# Patient Record
Sex: Female | Born: 1975 | Race: White | Hispanic: Yes | Marital: Single | Smoking: Former smoker
Health system: Southern US, Community
[De-identification: ages and names within clinical notes are randomized; demographics above are authoritative.]

## PROBLEM LIST (undated history)

## (undated) DIAGNOSIS — Z8742 Personal history of other diseases of the female genital tract: Secondary | ICD-10-CM

## (undated) DIAGNOSIS — Z8711 Personal history of peptic ulcer disease: Secondary | ICD-10-CM

## (undated) DIAGNOSIS — N920 Excessive and frequent menstruation with regular cycle: Secondary | ICD-10-CM

## (undated) DIAGNOSIS — N946 Dysmenorrhea, unspecified: Secondary | ICD-10-CM

## (undated) DIAGNOSIS — N979 Female infertility, unspecified: Secondary | ICD-10-CM

## (undated) DIAGNOSIS — Z8619 Personal history of other infectious and parasitic diseases: Secondary | ICD-10-CM

## (undated) DIAGNOSIS — F419 Anxiety disorder, unspecified: Secondary | ICD-10-CM

## (undated) DIAGNOSIS — F329 Major depressive disorder, single episode, unspecified: Secondary | ICD-10-CM

## (undated) DIAGNOSIS — L309 Dermatitis, unspecified: Secondary | ICD-10-CM

## (undated) DIAGNOSIS — Z86018 Personal history of other benign neoplasm: Secondary | ICD-10-CM

## (undated) DIAGNOSIS — F32A Depression, unspecified: Secondary | ICD-10-CM

## (undated) HISTORY — PX: WISDOM TOOTH EXTRACTION: SHX21

## (undated) HISTORY — DX: Major depressive disorder, single episode, unspecified: F32.9

## (undated) HISTORY — DX: Dysmenorrhea, unspecified: N94.6

## (undated) HISTORY — DX: Anxiety disorder, unspecified: F41.9

## (undated) HISTORY — DX: Depression, unspecified: F32.A

## (undated) HISTORY — DX: Female infertility, unspecified: N97.9

## (undated) HISTORY — DX: Dermatitis, unspecified: L30.9

---

## 2006-03-27 DIAGNOSIS — Z8742 Personal history of other diseases of the female genital tract: Secondary | ICD-10-CM

## 2006-03-27 HISTORY — DX: Personal history of other diseases of the female genital tract: Z87.42

## 2010-01-31 ENCOUNTER — Ambulatory Visit: Payer: Self-pay | Admitting: Internal Medicine

## 2010-01-31 DIAGNOSIS — K279 Peptic ulcer, site unspecified, unspecified as acute or chronic, without hemorrhage or perforation: Secondary | ICD-10-CM | POA: Insufficient documentation

## 2010-04-16 ENCOUNTER — Ambulatory Visit
Admission: RE | Admit: 2010-04-16 | Discharge: 2010-04-16 | Payer: Self-pay | Source: Home / Self Care | Attending: Internal Medicine | Admitting: Internal Medicine

## 2010-04-26 NOTE — Assessment & Plan Note (Signed)
Summary: NEW PT EST // RS   Vital Signs:  Patient profile:   35 year old female Height:      67.75 inches Weight:      157 pounds BMI:     24.14 Temp:     98.0 degrees F oral BP sitting:   98 / 70  (left arm) Cuff size:   regular  Vitals Entered By: Duard Brady LPN (January 31, 2010 2:08 PM) CC: new to establish - just moved here from [place]     declined flu vaccine Is Patient Diabetic? No CBG Result 137   CC:  new to establish - just moved here from [place]     declined flu vaccine.  History of Present Illness: 35 year old patient who is seen today to establish with her practice.  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 she has no health concerns and takes no chronic medication no nos.   Preventive Screening-Counseling & Management  Alcohol-Tobacco     Smoking Status: never  Allergies (verified): 1)  ! * Antihistamine  Past History:  Past Medical History: unremarkable Peptic ulcer disease- age 69  Past Surgical History: unremarkable  wisdom teeth, extraction  Family History: Reviewed history and no changes required. father died age 61.  History melanoma type 2 diabetes prostate cancer and cerebrovascular disease mother is 86 type 2 diabetes, obesity, breast  cancer, history of cerebrovascular disease  two brothers, one sister two half-brothers and two half-sisters  Social History: Reviewed history and no changes required. 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 discontinued tobacco earlier this yearSmoking Status:  never  Physical Exam  General:  Well-developed,well-nourished,in no acute distress; alert,appropriate and cooperative throughout examination Head:  Normocephalic and atraumatic without obvious abnormalities. No apparent alopecia or balding. Eyes:  No corneal or conjunctival inflammation noted. EOMI. Perrla. Funduscopic exam benign, without hemorrhages, exudates or papilledema. Vision grossly  normal. Ears:  External ear exam shows no significant lesions or deformities.  Otoscopic examination reveals clear canals, tympanic membranes are intact bilaterally without bulging, retraction, inflammation or discharge. Hearing is grossly normal bilaterally. Mouth:  Oral mucosa and oropharynx without lesions or exudates.  Teeth in good repair. Neck:  No deformities, masses, or tenderness noted. Chest Wall:  No deformities, masses, or tenderness noted. Lungs:  Normal respiratory effort, chest expands symmetrically. Lungs are clear to auscultation, no crackles or wheezes. Heart:  Normal rate and regular rhythm. S1 and S2 normal without gallop, murmur, click, rub or other extra sounds. Abdomen:  Bowel sounds positive,abdomen soft and non-tender without masses, organomegaly or hernias noted. Msk:  No deformity or scoliosis noted of thoracic or lumbar spine.   Pulses:  R and L carotid,radial,femoral,dorsalis pedis and posterior tibial pulses are full and equal bilaterally Extremities:  No clubbing, cyanosis, edema, or deformity noted with normal full range of motion of all joints.   Neurologic:  alert & oriented X3, cranial nerves II-XII intact, and gait normal.   Skin:  Intact without suspicious lesions or rashes Cervical Nodes:  No lymphadenopathy noted Psych:  Cognition and judgment appear intact. Alert and cooperative with normal attention span and concentration. No apparent delusions, illusions, hallucinations   Impression & Recommendations:  Problem # 1:  Preventive Health Care (ICD-V70.0)  Complete Medication List: 1)  No Current Rx Meds   Other Orders: Capillary Blood Glucose/CBG (27253)  Patient Instructions: 1)  Limit your Sodium (Salt). 2)  It is important that you exercise regularly at  least 20 minutes 5 times a week. If you develop chest pain, have severe difficulty breathing, or feel very tired , stop exercising immediately and seek medical attention. 3)  Please schedule a  follow-up appointment as needed.   Orders Added: 1)  Capillary Blood Glucose/CBG [82948] 2)  New Patient 18-39 years [99385]

## 2010-04-28 NOTE — Assessment & Plan Note (Signed)
Summary: ? PINKEYE//CCM   Vital Signs:  Patient profile:   35 year old female Weight:      161 pounds Temp:     99.9 degrees F oral Pulse rate:   80 / minute BP sitting:   110 / 58  (left arm) Cuff size:   regular  Vitals Entered By: Lamar Sprinkles, CMA (April 16, 2010 9:16 AM) CC: Right eye - matted, uncomfortable & itchy x 2 days/SD   History of Present Illness: Laurie Lewis comes in today  for  2-3 days of right eye redness discharge and irritation  . No uri although slightly stuffy o right. No fever and no exposrues.   NO hx of same since a child  Has been using compresses and eye wash helping it. better today but kept appt  .   No contacts and  NO fever  uri .    Preventive Screening-Counseling & Management  Alcohol-Tobacco     Smoking Status: never  Allergies: 1)  ! 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   Eyes:  vision grossly intact, pupils equal, and pupils round.  right eye with mild injection and no acitve dc at present  Ears:  R ear normal, L ear normal, and no external deformities.   Nose:  no external deformity, no external erythema, and no nasal discharge.  non tender  piercing no infection Mouth:  pharynx pink and moist.   Neck:  No deformities, masses, or tenderness noted. Lungs:  normal respiratory effort and no intercostal retractions.   Heart:  normal rate and regular rhythm.   Skin:  turgor normal and color normal.   Cervical Nodes:  No lymphadenopathy noted Psych:  Oriented X3, normally interactive, and good eye contact.     Impression &  Recommendations:  Problem # 1:  CONJUNCTIVITIS (ICD-372.30) right   seems to get better with washes   poss viral vs bacterial   if   getting worse again add antibioitc . Expectant management  Her updated medication list for this problem includes:    Polymyxin B-trimethoprim 10000-0.1 Unit/ml-% Soln (Polymyxin b-trimethoprim) .[name]... 1-2 drops in affected eye   qid  for 7 days  as directed  Complete Medication List: 1)  No Current Rx Meds  2)  Polymyxin B-trimethoprim 10000-0.1 Unit/ml-% Soln (Polymyxin b-trimethoprim) .[name].. 1-2 drops in affected eye   qid  for 7 days  as directed  Patient Instructions: 1)  continue compresses   and can add antibiotic drops if needed. 2)  i f   persistent or  progressive  or change in vision call for follow up.  Prescriptions: POLYMYXIN B-TRIMETHOPRIM 10000-0.1 UNIT/ML-% SOLN (POLYMYXIN B-TRIMETHOPRIM) 1-2 drops in affected eye   qid  for 7 days  as directed  #1 bottle x 0   Entered and Authorized by:   Madelin Headings MD   Signed by:   Madelin Headings MD on 04/16/2010   Method used:   Print then Give to Patient   RxID:   [id number]    Orders Added: 1)  Est. Patient Level III [14782]

## 2010-06-29 ENCOUNTER — Ambulatory Visit (INDEPENDENT_AMBULATORY_CARE_PROVIDER_SITE_OTHER): Payer: BC Managed Care – PPO | Admitting: Family Medicine

## 2010-06-29 ENCOUNTER — Encounter: Payer: Self-pay | Admitting: Family Medicine

## 2010-06-29 VITALS — BP 100/60 | Temp 98.8°F | Ht 68.0 in | Wt 160.0 lb

## 2010-06-29 DIAGNOSIS — R42 Dizziness and giddiness: Secondary | ICD-10-CM

## 2010-06-29 MED ORDER — MECLIZINE HCL 25 MG PO TABS: 25.0000 mg | ORAL_TABLET | Freq: Three times a day (TID) | ORAL | Status: AC | PRN

## 2010-06-29 NOTE — Patient Instructions (Signed)
Benign Positional Vertigo (BPV)  Vertigo is a feeling that you are unsteady or that you or your surroundings are moving. Benign positional vertigo (BPV) is the most common form of vertigo. Benign means it does not have a serious cause. It is an upset in the balance system in your middle ear. This is troublesome but usually not serious. A viral infection or head injury are common causes, but often no cause is found. It is more common as we grow older.  SYMPTOMS  Sudden dizziness happens when you move your head in different directions. Some of the problems that come with this are:   Loss of balance    Throwing up      Blurred vision     Dizziness      Feeling sick to your stomach      DIAGNOSIS  Your caregiver may do some specialized testing to prove what is wrong.  HOME CARE INSTRUCTIONS   Rest and eat a well balanced diet.    Move slowly and do not make sudden body or head movements.    Do not drive a car or do any activities that could hurt you or others.    Lie down and rest. Take precautions to prevent falls.   SEEK IMMEDIATE MEDICAL CARE IF:   You develop headaches which are severe or lasting.    You develop continued vomiting.    A temporary loss or change of vision appears.    You notice temporary numbness on one side of your body.    You are temporarily unable to speak.    Temporary areas of weakness develop.    You have weakness or numbness in the face, arms, or legs.    You notice dizziness or difficulty walking.    You experience slurred speech or difficulty swallowing.   MAKE SURE YOU:     Understand these instructions.    Will watch your condition.    Will get help right away if you are not doing well or get worse.   Document Released: 12/19/2005 Document Re-Released: 06/29/2008  ExitCare Patient Information 2011 [employer].

## 2010-06-29 NOTE — Progress Notes (Signed)
  Subjective:    Patient ID: Laurie Lewis, female    DOB: [birth year], 35 y.o.   MRN: [id number]  HPI Patient seen with vertigo symptoms. Onset 2 nights ago after getting up to go the bathroom. Intermittent symptoms since then. Relatively mild symptoms. No vomiting but occasional mild nausea. Denies any fever, chills, headache, blurred vision, or any focal weakness. No recent nasal congestion. No hearing changes. No tinnitus.   Review of Systems  Constitutional: Negative for fever, chills and fatigue.  HENT: Negative for hearing loss, sore throat, tinnitus and ear discharge.   Respiratory: Negative for cough.   Cardiovascular: Negative for chest pain.  Neurological: Positive for dizziness. Negative for syncope, facial asymmetry, speech difficulty, weakness, numbness and headaches.  Hematological: Negative for adenopathy.       Objective:   Physical Exam  Constitutional: She is oriented to person, place, and time. She appears well-developed and well-nourished.  HENT:  Head: Normocephalic and atraumatic.  Right Ear: External ear normal.  Left Ear: External ear normal.  Mouth/Throat: Oropharynx is clear and moist. No oropharyngeal exudate.  Neck: Neck supple. No thyromegaly present.  Cardiovascular: Normal rate, regular rhythm and normal heart sounds.   No murmur heard. Pulmonary/Chest: Effort normal and breath sounds normal. She has no wheezes. She has no rales.  Lymphadenopathy:    She has no cervical adenopathy.  Neurological: She is alert and oriented to person, place, and time. She has normal reflexes. No cranial nerve deficit.  Skin: No rash noted.  Psychiatric: She has a normal mood and affect.          Assessment & Plan:  Vertigo. Suspect benign positional vertigo. Meclizine 25 mg every 6 hours when necessary. Educational handout given. Touch base with primary physician at persist one to 2 weeks

## 2010-12-05 ENCOUNTER — Other Ambulatory Visit (HOSPITAL_COMMUNITY): Payer: Self-pay | Admitting: Gynecology

## 2010-12-05 DIAGNOSIS — N979 Female infertility, unspecified: Secondary | ICD-10-CM

## 2010-12-08 ENCOUNTER — Ambulatory Visit (HOSPITAL_COMMUNITY)
Admission: RE | Admit: 2010-12-08 | Discharge: 2010-12-08 | Disposition: A | Discharge status: Home / Self Care | Payer: BC Managed Care – PPO | Source: Ambulatory Visit | Attending: Gynecology | Admitting: Gynecology

## 2010-12-08 DIAGNOSIS — N979 Female infertility, unspecified: Secondary | ICD-10-CM | POA: Insufficient documentation

## 2010-12-08 MED ORDER — IOHEXOL 300 MG/ML  SOLN: 15.0000 mL | Freq: Once | INTRAMUSCULAR | Status: AC | PRN

## 2011-08-16 ENCOUNTER — Ambulatory Visit (INDEPENDENT_AMBULATORY_CARE_PROVIDER_SITE_OTHER): Payer: BC Managed Care – PPO | Admitting: Family Medicine

## 2011-08-16 VITALS — BP 116/68 | HR 60 | Temp 98.6°F | Resp 16 | Ht 68.0 in | Wt 166.0 lb

## 2011-08-16 DIAGNOSIS — G4700 Insomnia, unspecified: Secondary | ICD-10-CM

## 2011-08-16 DIAGNOSIS — L237 Allergic contact dermatitis due to plants, except food: Secondary | ICD-10-CM

## 2011-08-16 DIAGNOSIS — L255 Unspecified contact dermatitis due to plants, except food: Secondary | ICD-10-CM

## 2011-08-16 DIAGNOSIS — R52 Pain, unspecified: Secondary | ICD-10-CM

## 2011-08-16 MED ORDER — METHYLPREDNISOLONE SODIUM SUCC 125 MG IJ SOLR
125.0000 mg | Freq: Once | INTRAMUSCULAR | Status: AC
  Administered 2011-08-16: 125 mg via INTRAMUSCULAR

## 2011-08-16 MED ORDER — ZOLPIDEM TARTRATE 5 MG PO TABS: 5.0000 mg | ORAL_TABLET | Freq: Every evening | ORAL | Status: DC | PRN

## 2011-08-16 MED ORDER — PREDNISONE 10 MG PO TABS: ORAL_TABLET | ORAL | Status: AC

## 2011-08-16 MED ORDER — TRAMADOL HCL 50 MG PO TABS: 50.0000 mg | ORAL_TABLET | Freq: Three times a day (TID) | ORAL | Status: AC | PRN

## 2011-08-16 NOTE — Progress Notes (Signed)
[facility]:  Office Visit  Chief Complaint:  Chief Complaint  Patient presents with  . Rash    x 5 days  arms legs, itchy, red, spreading.  affecting sleep    HPI: Laurie Lewis is a 36 y.o. female who complains of:  5 day history of posion ivy was given Medrol dose pack. No relief. Spreading. Was started on Keflex today. BUt her rash has gotten worse. She has tried Zyrtec without relief, calamine lotion, and also topical antihistaimines. Can;t sleep.   Past Medical History  Diagnosis Date  . Peptic ulcer disease    Past Surgical History  Procedure Date  . Wisdom tooth extraction    History   Social History  . Marital Status: Married    Spouse Name: N/A    Number of Children: N/A  . Years of Education: N/A   Social History Main Topics  . Smoking status: Former Smoker -- 0.5 packs/day for 20 years    Types: Cigarettes    Quit date: 06/28/2009  . Smokeless tobacco: None  . Alcohol Use: None  . Drug Use: None  . Sexually Active: None   Other Topics Concern  . None   Social History Narrative  . None   No family history on file. No Known Allergies Prior to Admission medications   Medication Sig Start Date End Date Taking? Authorizing Provider  cephALEXin (KEFLEX) 500 MG capsule Take 500 mg by mouth 3 (three) times daily.   Yes Historical Provider, MD  methylPREDNISolone (MEDROL) 4 MG tablet Take 4 mg by mouth daily.   Yes Historical Provider, MD     ROS: The patient denies fevers, chills, night sweats, unintentional weight loss, chest pain, palpitations, wheezing, dyspnea on exertion, nausea, vomiting, abdominal pain, dysuria, hematuria, melena, numbness, weakness, or tingling. + worsening rash  All other systems have been reviewed and were otherwise negative with the exception of those mentioned in the HPI and as above.    PHYSICAL EXAM: Filed Vitals:   08/16/11 1440  BP: 116/68  Pulse: 60  Temp: 98.6 F (37 C)  Resp: 16   Filed  Vitals:   08/16/11 1440  Height: 5\' 8"  (1.727 m)  Weight: 166 lb (75.297 kg)   Body mass index is 25.24 kg/(m^2).  General: Alert, tearful HEENT:  Normocephalic, atraumatic, oropharynx patent. EOMI, PERRLA, Tm NL. OP nl.  Cardiovascular:  Regular rate and rhythm, no rubs murmurs or gallops.  No Carotid bruits, radial pulse intact. No pedal edema.  Respiratory: Clear to auscultation bilaterally.  No wheezes, rales, or rhonchi.  No cyanosis, no use of accessory musculature GI: No organomegaly, abdomen is soft and non-tender, positive bowel sounds.  No masses. Skin: + diffuse erythematous, warm rash on bilateral arms, legs and stomach. Minimal excoriation. No weeping drainage.  Neurologic: Facial musculature symmetric. Psychiatric: Patient is appropriate throughout our interaction. Lymphatic: No cervical lymphadenopathy Musculoskeletal: Gait intact.   LABS: Results for orders placed in visit on 01/31/10  CONVERTED [facility] LAB      Component Value Range   Blood Glucose, Fingerstick 137       EKG/XRAY:   Primary read interpreted by Dr. Conley Rolls at [facility].   ASSESSMENT/PLAN: Encounter Diagnoses  Name Primary?  . Poison ivy Yes  . Pain   . Insomnia    1. Solumedrol injection x 1 in office, DC MEdrol Dose pack. Gave patient a longer prednisone taper. Encourage continued antihistamine use Zyrtec. Aloe prn. Cleanse with soap and water. Continue with  Keflex since already started. Advise to take PPI if start having GI upset from steroid use.   2. Pain control with Tramadol #20 3. Gave ambien 5 mg qhs prn #5.     Hamilton Capri PHUONG, DO 08/16/2011 3:36 PM

## 2011-12-06 ENCOUNTER — Ambulatory Visit (INDEPENDENT_AMBULATORY_CARE_PROVIDER_SITE_OTHER): Payer: Self-pay

## 2011-12-06 DIAGNOSIS — F4320 Adjustment disorder, unspecified: Secondary | ICD-10-CM

## 2011-12-13 ENCOUNTER — Ambulatory Visit (INDEPENDENT_AMBULATORY_CARE_PROVIDER_SITE_OTHER): Payer: Self-pay

## 2011-12-13 DIAGNOSIS — F4320 Adjustment disorder, unspecified: Secondary | ICD-10-CM

## 2011-12-20 ENCOUNTER — Ambulatory Visit (INDEPENDENT_AMBULATORY_CARE_PROVIDER_SITE_OTHER): Payer: Self-pay

## 2011-12-20 DIAGNOSIS — F4320 Adjustment disorder, unspecified: Secondary | ICD-10-CM

## 2013-07-19 ENCOUNTER — Ambulatory Visit (INDEPENDENT_AMBULATORY_CARE_PROVIDER_SITE_OTHER): Payer: BC Managed Care – PPO | Admitting: Family Medicine

## 2013-07-19 VITALS — BP 110/62 | HR 80 | Temp 98.2°F | Resp 16 | Ht 68.0 in | Wt 160.4 lb

## 2013-07-19 DIAGNOSIS — R509 Fever, unspecified: Secondary | ICD-10-CM

## 2013-07-19 DIAGNOSIS — R6883 Chills (without fever): Secondary | ICD-10-CM

## 2013-07-19 DIAGNOSIS — B349 Viral infection, unspecified: Secondary | ICD-10-CM

## 2013-07-19 DIAGNOSIS — B9789 Other viral agents as the cause of diseases classified elsewhere: Secondary | ICD-10-CM

## 2013-07-19 LAB — POCT UA - MICROSCOPIC ONLY
CASTS, UR, LPF, POC: NEGATIVE
CRYSTALS, UR, HPF, POC: NEGATIVE
MUCUS UA: NEGATIVE
YEAST UA: NEGATIVE

## 2013-07-19 LAB — POCT CBC
Granulocyte percent: 35 %G — AB (ref 37–80)
HCT, POC: 37.5 % — AB (ref 37.7–47.9)
Hemoglobin: 12.2 g/dL (ref 12.2–16.2)
LYMPH, POC: 3 (ref 0.6–3.4)
MCH: 29.6 pg (ref 27–31.2)
MCHC: 32.5 g/dL (ref 31.8–35.4)
MCV: 91 fL (ref 80–97)
MID (CBC): 0.7 (ref 0–0.9)
MPV: 8.3 fL (ref 0–99.8)
PLATELET COUNT, POC: 192 10*3/uL (ref 142–424)
POC Granulocyte: 2 (ref 2–6.9)
POC LYMPH %: 53.3 % — AB (ref 10–50)
POC MID %: 11.7 % (ref 0–12)
RBC: 4.12 M/uL (ref 4.04–5.48)
RDW, POC: 13.3 %
WBC: 5.7 10*3/uL (ref 4.6–10.2)

## 2013-07-19 LAB — POCT URINALYSIS DIPSTICK
Bilirubin, UA: NEGATIVE
Glucose, UA: NEGATIVE
Ketones, UA: NEGATIVE
Leukocytes, UA: NEGATIVE
NITRITE UA: NEGATIVE
PROTEIN UA: NEGATIVE
SPEC GRAV UA: 1.01
UROBILINOGEN UA: 0.2
pH, UA: 7

## 2013-07-19 NOTE — Progress Notes (Signed)
Subjective: Patient comes in here not feeling well. She has had intermittent low-grade fevers. She has some discomfort/stuffiness sensation in her ears. She did have a sore throat a little bit of couple of weeks ago preceding all of this. Is been a days now she has not been feeling well. She has a history of endometriosis. She thinks she is premenopausal but he still has one ovary and the cyst on the other and various things going on. She has not had a little stomach discomfort. She had a little fever again this morning and took some ibuprofen. She's been taking a lot of ibuprofen for the achiness. 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 Patient, that she is not pregnant. Her menstrual cycle is a few days late, not usual for her.  Objective: TMs normal on the right. Also normal the left has little hair in the canal. Her throat is clear. Neck supple without significant nodes. Chest clear to auscultation. Heart regular without murmurs. Abdomen soft, nontender. Skin unremarkable. No rashes. No CVA tenderness.  Assessment: Fever Achiness Ear discomfort Stress Delayed menses  Plan: CBC and urine  Results for orders placed in visit on 07/19/13  POCT CBC      Result Value Ref Range   WBC 5.7  4.6 - 10.2 K/uL   Lymph, poc 3.0  0.6 - 3.4   POC LYMPH PERCENT 53.3 (*) 10 - 50 %L   MID (cbc) 0.7  0 - 0.9   POC MID % 11.7  0 - 12 %M   POC Granulocyte 2.0  2 - 6.9   Granulocyte percent 35.0 (*) 37 - 80 %G   RBC 4.12  4.04 - 5.48 M/uL   Hemoglobin 12.2  12.2 - 16.2 g/dL   HCT, POC 37.5 (*) 37.7 - 47.9 %   MCV 91.0  80 - 97 fL   MCH, POC 29.6  27 - 31.2 pg   MCHC 32.5  31.8 - 35.4 g/dL   RDW, POC 13.3     Platelet Count, POC 192  142 - 424 K/uL   MPV 8.3  0 - 99.8 fL  POCT UA - MICROSCOPIC ONLY      Result Value Ref Range   WBC, Ur, HPF, POC 0-1     RBC, urine, microscopic 2-4     Bacteria, U Microscopic 1+     Mucus, UA neg     Epithelial cells,  urine per micros 0-2     Crystals, Ur, HPF, POC neg     Casts, Ur, LPF, POC neg     Yeast, UA neg    POCT URINALYSIS DIPSTICK      Result Value Ref Range   Color, UA yellow     Clarity, UA hazy     Glucose, UA neg     Bilirubin, UA neg     Ketones, UA neg     Spec Grav, UA 1.010     Blood, UA large     pH, UA 7.0     Protein, UA neg     Urobilinogen, UA 0.2     Nitrite, UA neg     Leukocytes, UA Negative     Nothing specific is found. Maybe just a viral syndrome. Return if worse.

## 2013-07-19 NOTE — Patient Instructions (Addendum)
Rest  Drink plenty of fluids  Return if further symptoms or if fever persists  Ibuprofen or tylenol for aching or fever

## 2014-06-18 ENCOUNTER — Ambulatory Visit (INDEPENDENT_AMBULATORY_CARE_PROVIDER_SITE_OTHER): Payer: BC Managed Care – PPO | Admitting: Emergency Medicine

## 2014-06-18 VITALS — BP 120/72 | HR 74 | Temp 97.9°F | Resp 16 | Ht 68.0 in | Wt 149.0 lb

## 2014-06-18 DIAGNOSIS — N946 Dysmenorrhea, unspecified: Secondary | ICD-10-CM | POA: Diagnosis not present

## 2014-06-18 DIAGNOSIS — N809 Endometriosis, unspecified: Secondary | ICD-10-CM

## 2014-06-18 MED ORDER — IBUPROFEN 800 MG PO TABS: 800.0000 mg | ORAL_TABLET | Freq: Four times a day (QID) | ORAL | Status: DC | PRN

## 2014-06-18 MED ORDER — HYDROCODONE-ACETAMINOPHEN 5-325 MG PO TABS: 1.0000 | ORAL_TABLET | ORAL | Status: DC | PRN

## 2014-06-18 NOTE — Progress Notes (Signed)
[facility] [address] [place] [address] [phone]  Date:  06/18/2014   Name:  Laurie Lewis   DOB:  December 19, 1975   MRN:  [id number]  PCP:  Nyoka Cowden, MD    Chief Complaint: Abdominal Pain; arm cramps; and leg cramps   History of Present Illness:  Laurie Lewis is a 39 y.o. very pleasant female patient who presents with the following:  Long history of endometriosis and ovarian cyst Last saw GYN 18 months ago Has increased dysmenorrhea.  No vaginal discharge or dyspareunia Lowe abdominal pain before menses over past several cycles No clots or tissue Nulligravida. No dysuria, urgency or frequency Gets some pain relief with OTC motrin No fever or chills No improvement with over the counter medications or other home remedies.  Denies other complaint or health concern today.   Patient Active Problem List   Diagnosis Date Noted  . PEPTIC ULCER DISEASE 01/31/2010    Past Medical History  Diagnosis Date  . Peptic ulcer disease     Past Surgical History  Procedure Laterality Date  . Wisdom tooth extraction      History  Substance Use Topics  . Smoking status: Former Smoker -- 0.50 packs/day for 20 years    Types: Cigarettes    Quit date: 06/28/2009  . Smokeless tobacco: Not on file  . Alcohol Use: Not on file    History reviewed. No pertinent family history.  No Known Allergies  Medication list has been reviewed and updated.  Current Outpatient Prescriptions on File Prior to Visit  Medication Sig Dispense Refill  . zolpidem (AMBIEN) 5 MG tablet Take 1 tablet (5 mg total) by mouth at bedtime as needed for sleep. 5 tablet 0   No current facility-administered medications on file prior to visit.    Review of Systems:  As per HPI, otherwise negative.    Physical Examination: Filed Vitals:   06/18/14 1412  BP: 120/72  Pulse: 74  Temp: 97.9 F (36.6 C)  Resp: 16   Filed Vitals:   06/18/14 1412  Height: 5\' 8"   (1.727 m)  Weight: 149 lb (67.586 kg)   Body mass index is 22.66 kg/(m^2). Ideal Body Weight: Weight in (lb) to have BMI = 25: 164.1  GEN: WDWN, NAD, Non-toxic, A & O x 3 HEENT: Atraumatic, Normocephalic. Neck supple. No masses, No LAD. Ears and Nose: No external deformity. CV: RRR, No M/G/R. No JVD. No thrill. No extra heart sounds. PULM: CTA B, no wheezes, crackles, rhonchi. No retractions. No resp. distress. No accessory muscle use. ABD: S, lower abdominal suprapubic tenderness, ND, +BS. No rebound. No HSM. EXTR: No c/c/e NEURO Normal gait.  PSYCH: Normally interactive. Conversant. Not depressed or anxious appearing.  Calm demeanor.    Assessment and Plan: Dysmenorrhea Pelvic pain GYN norco  Signed,  Ellison Carwin, MD

## 2014-06-22 ENCOUNTER — Telehealth: Payer: Self-pay | Admitting: Internal Medicine

## 2014-06-22 DIAGNOSIS — N809 Endometriosis, unspecified: Secondary | ICD-10-CM

## 2014-06-22 NOTE — Telephone Encounter (Signed)
Please see message and advise 

## 2014-06-22 NOTE — Telephone Encounter (Signed)
Pt has not been seen by in 3+ years. Wants to come back in to be seen for leg pain. 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 Does not want to see Dr Avanell Shackleton going forward. Will you see pt even though it has been over 3 years? I will call her back and advise.

## 2014-06-22 NOTE — Telephone Encounter (Signed)
Yes, okay to refer to gynecology

## 2014-06-23 NOTE — Telephone Encounter (Signed)
Estill Bamberg, Dr.K said he will see pt can re-establish care and also tell pt order for referral to GYN was done and someone will contact her regarding an appointment. Thanks

## 2014-07-06 ENCOUNTER — Ambulatory Visit (INDEPENDENT_AMBULATORY_CARE_PROVIDER_SITE_OTHER): Payer: BC Managed Care – PPO | Admitting: Obstetrics and Gynecology

## 2014-07-06 ENCOUNTER — Encounter: Payer: Self-pay | Admitting: Obstetrics and Gynecology

## 2014-07-06 VITALS — BP 100/70 | HR 66 | Resp 16 | Ht 68.0 in | Wt 142.4 lb

## 2014-07-06 DIAGNOSIS — N946 Dysmenorrhea, unspecified: Secondary | ICD-10-CM

## 2014-07-06 DIAGNOSIS — N939 Abnormal uterine and vaginal bleeding, unspecified: Secondary | ICD-10-CM | POA: Diagnosis not present

## 2014-07-06 LAB — HEMOGLOBIN, FINGERSTICK: HEMOGLOBIN, FINGERSTICK: 13.3 g/dL (ref 12.0–16.0)

## 2014-07-06 NOTE — Progress Notes (Signed)
Patient declined to schedule pelvic ultrasound at this time. Has pelvic ultrasound scheduled on 07-20-14 with another practice and will forward results to [place]. Requests office visit following this appointment to review pelvic ultrasound with Dr Quincy Simmonds. Appointment 07-24-14 at 1030 as requested.

## 2014-07-06 NOTE — Progress Notes (Signed)
Patient ID: Laurie Lewis, female   DOB: [birth year], 39 y.o.   MRN: [id number] GYNECOLOGY VISIT  PCP:  None  Referring provider:  Urgent Care  HPI: 39 y.o.   Married  Caucasian  female   [id number] with Patient's last menstrual period was 06/19/2014 (exact date).   here for heavy menstrual periods with pain in legs and hip. Her last menstrual cycle did last for 16 days.  Having vaginal pain.   Always had painful periods.  Heavy for the last 10 menses.  Severe pain for the last 2 cycles.  Had leg pain which started before her menses even began.  Ibuprofen would dull the pain in the past.  Hydrocodone from urgent care helps pain.  Feels pressure - numbness and hurting at the same time.   Last menses did not stop.  Placed on Provera 30 mg since 07/03/14 through Dr. Leo Grosser.   This finally stopped her bleeding on day 16. (This is the only time it is irregular.) Has not tried Tramadol from Dr. Leo Grosser yet.   Bilateral calf discomfort in the last week.   No prior investigation of pain and bleeding.  Stopped Naproxen due to history of ulcer.  Able to take ibuprofen.   History of fertility evaluation.  Last ultrasound was two years ago.  Was told she had an ovarian cyst - endometrioma. No prior laparoscopy.  Not pursuing childbearing.   Took Micronor in past and had menses every 2 weeks. Also took combined OCPs and had painful menses and migraine headaches.   Smoker occasionally.   Night time urination.   Cramps feel related to bowel movements.  No blood in stool.  No blood in the urine.   Had CA125, [facility] through Dr. Caralee Ates office.  No Hgb done.   GYNECOLOGIC HISTORY: Patient's last menstrual period was 06/19/2014 (exact date). Sexually active:  yes Partner preference: female and female(currently female partner) Contraception:   condoms Menopausal hormone therapy: n/a DES exposure: no   Blood transfusions:  yes  Sexually transmitted diseases:  HSV I, Chlamydia  GYN  procedures and prior surgeries:  none Last mammogram: In her 20's d/t an injury--normal             Last pap and high risk HPV testing: 07-03-14 History of abnormal pap smear: no    OB History    Gravida Para Term Preterm AB TAB SAB Ectopic Multiple Living   0 0 0 0 0 0 0 0 0 0        Past Medical History  Diagnosis Date  . Peptic ulcer disease   . Blood transfusion without reported diagnosis     bleeding ?peptic ulcer at age 58  . Anxiety   . Depression   . Dysmenorrhea   . Fibroid   . STD (sexually transmitted disease)     HSV I, Pos. Chlamydia  . Eczema     Past Surgical History  Procedure Laterality Date  . Wisdom tooth extraction      Current Outpatient Prescriptions  Medication Sig Dispense Refill  . HYDROcodone-acetaminophen (NORCO) 5-325 MG per tablet Take 1-2 tablets by mouth every 4 (four) hours as needed. 30 tablet 0  . ibuprofen (ADVIL,MOTRIN) 800 MG tablet Take 1 tablet (800 mg total) by mouth every 6 (six) hours as needed. 50 tablet 2  . medroxyPROGESTERone (PROVERA) 10 MG tablet Take 30 mg by mouth daily.  0  . traMADol (ULTRAM) 50 MG tablet   0   No current facility-administered medications for  this visit.     ALLERGIES: Benadryl and Epinephrine  Family History  Problem Relation Age of Onset  . Breast cancer Mother 6  . Diabetes Mother   . Hypertension Mother   . Stroke Mother   . Breast cancer Maternal Grandmother 62    dec  . Cancer Father 9    dec--old age at 37  . Diabetes Father   . Stroke Father   . Diabetes Paternal Grandfather    Father had skin cancer and colon cancer.   History   Social History  . Marital Status: Married    Spouse Name: N/A  . Number of Children: N/A  . Years of Education: N/A   Occupational History  . Not on file.   Social History Main Topics  . Smoking status: Current Every Day Smoker -- 0.50 packs/day for 20 years    Types: Cigarettes    Last Attempt to Quit: 06/28/2009  . Smokeless tobacco: Not on  file  . Alcohol Use: 3.0 oz/week    5 Standard drinks or equivalent per week  . Drug Use: No  . Sexual Activity:    Partners: Female, Female    Birth Control/ Protection: Condom   Other Topics Concern  . Not on file   Social History Narrative    ROS:  Pertinent items are noted in HPI.  PHYSICAL EXAMINATION:    BP 100/70 mmHg  Pulse 66  Resp 16  Ht 5\' 8"  (1.727 m)  Wt 142 lb 6.4 oz (64.592 kg)  BMI 21.66 kg/m2  LMP 06/19/2014 (Exact Date)   Wt Readings from Last 3 Encounters:  07/06/14 142 lb 6.4 oz (64.592 kg)  06/18/14 149 lb (67.586 kg)  07/19/13 160 lb 6.4 oz (72.757 kg)     Ht Readings from Last 3 Encounters:  07/06/14 5\' 8"  (1.727 m)  06/18/14 5\' 8"  (1.727 m)  07/19/13 5\' 8"  (1.727 m)    General appearance: alert, cooperative and appears stated age Head: Normocephalic, without obvious abnormality, atraumatic Lungs: clear to auscultation bilaterally Heart: regular rate and rhythm Abdomen: soft, non-tender; no masses,  no organomegaly No abnormal inguinal nodes palpated Neurologic: Grossly normal  Pelvic: External genitalia:  no lesions              Urethra:  normal appearing urethra with no masses, tenderness or lesions              Bartholins and Skenes: normal                 Vagina: normal appearing vagina with normal color and discharge, no lesions.  Vague fullness of the right adnexa.               Cervix: normal appearance                 Bimanual Exam:  Uterus:  uterus is normal size, shape, consistency and nontender                                      Adnexa: normal adnexa in size, nontender and no masses                                      Rectovaginal: Confirms  Anus:  normal sphincter tone, no lesions  ASSESSMENT  Dysmenorrhea.  Abnormal uterine bleeding.  Treated with high dose Provera.  History of endometrioma. Patient seeing two physician providers at different practices.  Smoker.  PLAN  Hgb now.   Proceed with pelvic ultrasound.  Patient is not sure if she will do this through this office or through Dr. Caralee Ates.  Seems to be where ever she can get in faster.  Discussion of pelvic pain, endometriosis, adenomyosis.  Discussed treatment options - Depo Provera, Mirena IUD, progesterone, and surgical care. I have shared with patient that eventually she will need to choose a provider so that she has good coordination of care.   She agrees.   An After Visit Summary was printed and given to the patient.   __30_____ minutes face to face time of which over 50% was spent in counseling.

## 2014-07-06 NOTE — Patient Instructions (Signed)
We will call you to schedule the pelvic ultrasound.

## 2014-07-07 ENCOUNTER — Telehealth: Payer: Self-pay | Admitting: Obstetrics and Gynecology

## 2014-07-07 NOTE — Telephone Encounter (Signed)
Left message for patient to call back. Need to go over benefits for PUS °

## 2014-07-10 NOTE — Telephone Encounter (Signed)
Call to patient to schedule PUS. Patient states that she is having PUS performed at [facility]

## 2014-07-16 ENCOUNTER — Telehealth: Payer: Self-pay | Admitting: Obstetrics and Gynecology

## 2014-07-16 NOTE — Telephone Encounter (Signed)
Thank you for the update.  I have closed the encounter.   Laurie Lewis

## 2014-07-16 NOTE — Telephone Encounter (Signed)
Patient cancelled her appointment for follow up and to discuss results with Dr. Quincy Simmonds on 07/24/14. She says she will see another doctor for this and that Dr. Quincy Simmonds is already aware she is seeing another doctor too.

## 2014-07-24 ENCOUNTER — Ambulatory Visit: Payer: BC Managed Care – PPO | Admitting: Obstetrics and Gynecology

## 2015-09-16 ENCOUNTER — Other Ambulatory Visit: Payer: Self-pay | Admitting: Obstetrics and Gynecology

## 2015-09-16 DIAGNOSIS — R5381 Other malaise: Secondary | ICD-10-CM

## 2015-09-21 ENCOUNTER — Other Ambulatory Visit: Payer: Self-pay | Admitting: Obstetrics and Gynecology

## 2015-09-21 DIAGNOSIS — Z79811 Long term (current) use of aromatase inhibitors: Secondary | ICD-10-CM

## 2015-10-05 ENCOUNTER — Ambulatory Visit
Admission: RE | Admit: 2015-10-05 | Discharge: 2015-10-05 | Disposition: A | Discharge status: Home / Self Care | Payer: BC Managed Care – PPO | Source: Ambulatory Visit | Attending: Obstetrics and Gynecology | Admitting: Obstetrics and Gynecology

## 2015-10-05 DIAGNOSIS — Z79811 Long term (current) use of aromatase inhibitors: Secondary | ICD-10-CM

## 2015-11-25 ENCOUNTER — Encounter (HOSPITAL_COMMUNITY): Payer: Self-pay | Admitting: Emergency Medicine

## 2015-11-25 ENCOUNTER — Ambulatory Visit (HOSPITAL_COMMUNITY)
Admission: EM | Admit: 2015-11-25 | Discharge: 2015-11-25 | Disposition: A | Discharge status: Home / Self Care | Payer: BC Managed Care – PPO | Attending: Family Medicine | Admitting: Family Medicine

## 2015-11-25 DIAGNOSIS — M797 Fibromyalgia: Secondary | ICD-10-CM | POA: Diagnosis not present

## 2015-11-25 DIAGNOSIS — M7918 Myalgia, other site: Secondary | ICD-10-CM

## 2015-11-25 MED ORDER — CYCLOBENZAPRINE HCL 10 MG PO TABS
10.0000 mg | ORAL_TABLET | Freq: Two times a day (BID) | ORAL | 0 refills | Status: DC | PRN
Start: 2015-11-25 — End: 2016-03-31

## 2015-11-25 MED ORDER — NAPROXEN 500 MG PO TABS: 500.0000 mg | ORAL_TABLET | Freq: Two times a day (BID) | ORAL | 0 refills | Status: DC

## 2015-11-25 NOTE — ED Provider Notes (Signed)
CSN: [id number]     Arrival date & time 11/25/15  1232 History   None    Chief Complaint  Patient presents with  . Back Pain   (Consider location/radiation/quality/duration/timing/severity/associated sxs/prior Treatment) Patient c/o upper back pain    Back Pain  Location:  Thoracic spine Quality:  Aching Pain severity:  Moderate Onset quality:  Sudden Duration:  1 day Timing:  Constant Progression:  Worsening Chronicity:  New Relieved by:  Nothing Worsened by:  Nothing Ineffective treatments:  None tried   Past Medical History:  Diagnosis Date  . Anxiety   . Blood transfusion without reported diagnosis    bleeding ?peptic ulcer at age 22  . Depression   . Dysmenorrhea   . Eczema   . Fibroid   . Infertility, female   . Peptic ulcer disease   . STD (sexually transmitted disease)    HSV I, Pos. Chlamydia   Past Surgical History:  Procedure Laterality Date  . WISDOM TOOTH EXTRACTION     Family History  Problem Relation Age of Onset  . Breast cancer Mother 55  . Diabetes Mother   . Hypertension Mother   . Stroke Mother   . Breast cancer Maternal Grandmother 41    dec  . Cancer Father 55    dec--old age at 52  . Diabetes Father   . Stroke Father   . Diabetes Paternal Grandfather    Social History  Substance Use Topics  . Smoking status: Current Every Day Smoker    Packs/day: 0.50    Years: 20.00    Types: Cigarettes    Last attempt to quit: 06/28/2009  . Smokeless tobacco: Not on file  . Alcohol use 3.0 oz/week    5 Standard drinks or equivalent per week   OB History    Gravida Para Term Preterm AB Living   0 0 0 0 0 0   SAB TAB Ectopic Multiple Live Births   0 0 0 0       Review of Systems  Constitutional: Negative.   HENT: Negative.   Eyes: Negative.   Respiratory: Negative.   Cardiovascular: Negative.   Gastrointestinal: Negative.   Endocrine: Negative.   Genitourinary: Negative.   Musculoskeletal: Positive for back pain.   Allergic/Immunologic: Negative.   Hematological: Negative.   Psychiatric/Behavioral: Negative.     Allergies  Benadryl [diphenhydramine] and Epinephrine  Home Medications   Prior to Admission medications   Medication Sig Start Date End Date Taking? Authorizing Provider  cyclobenzaprine (FLEXERIL) 10 MG tablet Take 1 tablet (10 mg total) by mouth 2 (two) times daily as needed for muscle spasms. 11/25/15   Lysbeth Penner, FNP  HYDROcodone-acetaminophen (NORCO) 5-325 MG per tablet Take 1-2 tablets by mouth every 4 (four) hours as needed. 06/18/14   Roselee Culver, MD  ibuprofen (ADVIL,MOTRIN) 800 MG tablet Take 1 tablet (800 mg total) by mouth every 6 (six) hours as needed. 06/18/14   Roselee Culver, MD  medroxyPROGESTERone (PROVERA) 10 MG tablet Take 30 mg by mouth daily. 07/03/14   Historical Provider, MD  naproxen (NAPROSYN) 500 MG tablet Take 1 tablet (500 mg total) by mouth 2 (two) times daily with a meal. 11/25/15   Lysbeth Penner, FNP  traMADol Veatrice Bourbon) 50 MG tablet  07/03/14   Historical Provider, MD   Meds Ordered and Administered this Visit  Medications - No data to display  BP 100/62 (BP Location: Left Arm)   Pulse 70   Temp 98.7 F (  37.1 C) (Oral)   Resp 16   LMP 10/11/2015   SpO2 99%  No data found.   Physical Exam  Constitutional: She appears well-developed and well-nourished.  HENT:  Head: Normocephalic and atraumatic.  Eyes: EOM are normal. Pupils are equal, round, and reactive to light.  Neck: Normal range of motion. Neck supple.  Cardiovascular: Normal rate, regular rhythm and normal heart sounds.   Pulmonary/Chest: Effort normal and breath sounds normal.  Musculoskeletal: She exhibits tenderness.  TTP myofascial region  Nursing note and vitals reviewed.   Urgent Care Course   Clinical Course    Procedures (including critical care time)  Labs Review Labs Reviewed - No data to display  Imaging Review No results found.   Visual Acuity  Review  Right Eye Distance:   Left Eye Distance:   Bilateral Distance:    Right Eye Near:   Left Eye Near:    Bilateral Near:         MDM   1. Muscle pain, myofascial    Naprosyn 500mg  one po bid x 10 days #20 Flexeril 10 mg one po bid prn #20  Get deep muscle massage    Lysbeth Penner, FNP 11/25/15 [place], [place] 11/25/15 1453

## 2015-11-25 NOTE — ED Triage Notes (Signed)
Pt states she was doing Acro-Yoga on Saturday and she started having right upper back pain that starts in her right neck and radiates down towards the shoulder blade and below.  She has seen her chiropractor twice this week, which has helped with the vertigo she was having related to the pain, but the back pain has not gotten better.

## 2016-02-25 ENCOUNTER — Ambulatory Visit: Payer: Self-pay | Admitting: Family Medicine

## 2016-02-27 NOTE — Progress Notes (Signed)
[facility] [address] [place], [place] [address] Phone: [phone] Subjective:    CC: Hip pain Left  [id number]  [name] is a 40 y.o. female coming in with complaint of hip pain. Patient states that this is been intermittent for multiple years. Seems to be worsening over the course last several weeks. Patient has been doing Pilates an extensive amount of yoga. Patient has noticed that she is able to externally rotate her leg very easily but unfortunately continues to have pain with internal range of motion. Pain seems to be in the groin area. Patient denies any lateral aspect the pain. States that sometimes it can go down the leg. Very minimal. Rates the severity of pain a 7 out of 10. Patient is concerned because she cannot increase her activity because sometimes it seems to make it more aggravated. Denies any significant discomfort with daily activities. Denies any weakness associated with it. Very minimal back pain associated with it but not all the time.     Past Medical History:  Diagnosis Date  . Anxiety   . Blood transfusion without reported diagnosis    bleeding ?peptic ulcer at age 26  . Depression   . Dysmenorrhea   . Eczema   . Fibroid   . Infertility, female   . Peptic ulcer disease   . STD (sexually transmitted disease)    HSV I, Pos. Chlamydia   Past Surgical History:  Procedure Laterality Date  . WISDOM TOOTH EXTRACTION     Social History   Social History  . Marital status: Divorced    Spouse name: N/A  . Number of children: N/A  . Years of education: N/A   Social History Main Topics  . Smoking status: Current Every Day Smoker    Packs/day: 0.50    Years: 20.00    Types: Cigarettes    Last attempt to quit: 06/28/2009  . Smokeless tobacco: Not on file  . Alcohol use 3.0 oz/week    5 Standard drinks or equivalent per week  . Drug use: No  . Sexual activity: Yes    Partners: Female, Female    Birth control/  protection: Condom     Comment: current partner female(2016)   Other Topics Concern  . Not on file   Social History Narrative  . No narrative on file   Allergies  Allergen Reactions  . Benadryl [Diphenhydramine] Other (See Comments)    "Hypes her up"  . Epinephrine Other (See Comments)    "Hypes her up"--patient doesn't tolerate well   Family History  Problem Relation Age of Onset  . Breast cancer Mother 62  . Diabetes Mother   . Hypertension Mother   . Stroke Mother   . Breast cancer Maternal Grandmother 37    dec  . Cancer Father 27    dec--old age at 45  . Diabetes Father   . Stroke Father   . Diabetes Paternal Grandfather     Past medical history, social, surgical and family history all reviewed in electronic medical record.  No pertanent information unless stated regarding to the chief complaint.   Review of Systems:Review of systems updated and as accurate as of 02/28/16  No headache, visual changes, nausea, vomiting, diarrhea, constipation, dizziness, abdominal pain, skin rash, fevers, chills, night sweats, weight loss, swollen lymph nodes, body aches, joint swelling, muscle aches, chest pain, shortness of breath, mood changes.   Objective  Blood pressure 100/72, pulse 66, height 5\' 8"  (1.727  m), weight 170 lb 6.4 oz (77.3 kg). Systems examined below as of 02/28/16   General: No apparent distress alert and oriented x3 mood and affect normal, dressed appropriately.  HEENT: Pupils equal, extraocular movements intact  Respiratory: Patient's speak in full sentences and does not appear short of breath  Cardiovascular: No lower extremity edema, non tender, no erythema  Skin: Warm dry intact with no signs of infection or rash on extremities or on axial skeleton.  Abdomen: Soft nontender  Neuro: Cranial nerves II through XII are intact, neurovascularly intact in all extremities with 2+ DTRs and 2+ pulses.  Lymph: No lymphadenopathy of posterior or anterior cervical chain  or axillae bilaterally.  Gait normal with good balance and coordination.  MSK:  Non tender with full range of motion and good stability and symmetric strength and tone of shoulders, elbows, wrist, knee and ankles bilaterally.  [id number] ROM IR: 5 Deg, ER: 55 Deg, Flexion: 120 Deg, Extension: 80 Deg, Abduction: 45 Deg, Adduction: 15 Deg Strength IR: 5/5, ER: 5/5, Flexion: 5/5, Extension: 5/5, Abduction: 5/5, Adduction: 5/5 Pelvic alignment unremarkable to inspection and palpation. Standing hip rotation and gait without trendelenburg sign / unsteadiness. Greater trochanter without tenderness to palpation. No tenderness over piriformis and greater trochanter. Positive pain with internal rotation positive pain with labral irritation. No SI joint tenderness and normal minimal SI movement. Contralateral hip also has limited internal range of motion but of 10 and still increase external range of motion.  Procedure note [id number]; 15 minutes spent for Therapeutic exercises as stated in above notes.  This included exercises focusing on stretching, strengthening, with significant focus on eccentric aspects. Hip strengthening exercises which included:  Pelvic tilt/bracing to help with proper recruitment of the lower abs and pelvic floor muscles  Glute strengthening to properly contract glutes without over-engaging low back and hamstrings - prone hip extension and glute bridge exercises Proper stretching techniques to increase effectiveness for the hip flexors, groin, quads, piriformic and low back when appropriate    Proper technique shown and discussed handout in great detail with ATC.  All questions were discussed and answered.      Impression and Recommendations:     This case required medical decision making of moderate complexity.      Note: This dictation was prepared with Dragon dictation along with smaller phrase technology. Any transcriptional errors that result from this process are  unintentional.

## 2016-02-28 ENCOUNTER — Ambulatory Visit (INDEPENDENT_AMBULATORY_CARE_PROVIDER_SITE_OTHER): Payer: BC Managed Care – PPO | Admitting: Family Medicine

## 2016-02-28 ENCOUNTER — Other Ambulatory Visit: Payer: Self-pay | Admitting: Family Medicine

## 2016-02-28 ENCOUNTER — Ambulatory Visit (INDEPENDENT_AMBULATORY_CARE_PROVIDER_SITE_OTHER)
Admission: RE | Admit: 2016-02-28 | Discharge: 2016-02-28 | Disposition: A | Discharge status: Home / Self Care | Payer: BC Managed Care – PPO | Source: Ambulatory Visit | Attending: Family Medicine | Admitting: Family Medicine

## 2016-02-28 ENCOUNTER — Encounter: Payer: Self-pay | Admitting: Family Medicine

## 2016-02-28 VITALS — BP 100/72 | HR 66 | Ht 68.0 in | Wt 170.4 lb

## 2016-02-28 DIAGNOSIS — M25552 Pain in left hip: Secondary | ICD-10-CM | POA: Insufficient documentation

## 2016-02-28 NOTE — Patient Instructions (Signed)
Good to see you.  I think you do have a little bit of a hip issue and we will figure it out.  Xray downstairs today  Ice 20 minutes 2 times daily. Usually after activity and before bed. Exercises 3 times a week.  pennsaid pinkie amount topically 2 times daily as needed.  Vitamin D 2000 IU daily can help with muscle strength and endurance Spenco orthotics "total support" online would be great  Stay active but if it causes pain do not do it.  See me again in 4 weeks.  Happy holidays!

## 2016-02-28 NOTE — Assessment & Plan Note (Signed)
Patient has significant amount of external rotation but minimal internal rotation. Especially of the left hip. X-rays are pending to rule out any bony abnormality. No new bleed the patient likely had more of the hip dysplasia at some point. Discussed with patient about icing regimen, home exercises, we discussed different pain medications for breakthrough.

## 2016-03-31 ENCOUNTER — Ambulatory Visit (INDEPENDENT_AMBULATORY_CARE_PROVIDER_SITE_OTHER): Payer: BC Managed Care – PPO | Admitting: Family Medicine

## 2016-03-31 ENCOUNTER — Encounter: Payer: Self-pay | Admitting: Family Medicine

## 2016-03-31 DIAGNOSIS — M25552 Pain in left hip: Secondary | ICD-10-CM | POA: Diagnosis not present

## 2016-03-31 NOTE — Assessment & Plan Note (Signed)
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 I believe the patient's history of hip dysplasia cause even a potential labral pathology. Discussed with patient at great length. We discussed that to continue home exercises given different ones for had adduction strengthening. We discussed icing regimen. We discussed objective is doing which was to avoid. Patient will come back and see me again

## 2016-03-31 NOTE — Patient Instructions (Signed)
Good to see you  Laurie Lewis is your friend.  I do think you have a labral tear and I have tricks if you need it.  I think you will do great  Decrease my exercises to 2 times a week and increase the things you enjoy Careful with internal rotation of your hip  See me when you need me.

## 2016-03-31 NOTE — Progress Notes (Signed)
[facility] [address] [place], [place] [address] Phone: [phone] Subjective:    CC: Hip pain Left f./u  [id number]  Laurie Lewis is a 41 y.o. female coming in with complaint of hip pain. Patient states that this is been intermittent for multiple years.  Patient seen by me and had more greater trochanter bursitis as well as side tendinitis. Patient responded well to the home exercises and states that it seems to be better. Unfortunately now having more groin pain. States that it seems to be severe with rotation of the leg inward. Patient denies any radiation down the leg. Denies any instability. States that it's a sharp pain though. States that it can give her discomfort more than usual.     Past Medical History:  Diagnosis Date  . Anxiety   . Blood transfusion without reported diagnosis    bleeding ?peptic ulcer at age 32  . Depression   . Dysmenorrhea   . Eczema   . Fibroid   . Infertility, female   . Peptic ulcer disease   . STD (sexually transmitted disease)    HSV I, Pos. Chlamydia   Past Surgical History:  Procedure Laterality Date  . WISDOM TOOTH EXTRACTION     Social History   Social History  . Marital status: Divorced    Spouse name: N/A  . Number of children: N/A  . Years of education: N/A   Social History Main Topics  . Smoking status: Current Every Day Smoker    Packs/day: 0.50    Years: 20.00    Types: Cigarettes    Last attempt to quit: 06/28/2009  . Smokeless tobacco: None  . Alcohol use 3.0 oz/week    5 Standard drinks or equivalent per week  . Drug use: No  . Sexual activity: Yes    Partners: Female, Female    Birth control/ protection: Condom     Comment: current partner female(2016)   Other Topics Concern  . None   Social History Narrative  . None   Allergies  Allergen Reactions  . Benadryl [Diphenhydramine] Other (See Comments)    "Hypes her up"  . Epinephrine Other (See Comments)    "Hypes her  up"--patient doesn't tolerate well   Family History  Problem Relation Age of Onset  . Breast cancer Mother 4  . Diabetes Mother   . Hypertension Mother   . Stroke Mother   . Breast cancer Maternal Grandmother 49    dec  . Cancer Father 53    dec--old age at 64  . Diabetes Father   . Stroke Father   . Diabetes Paternal Grandfather     Past medical history, social, surgical and family history all reviewed in electronic medical record.  No pertanent information unless stated regarding to the chief complaint.   Review of Systems: No headache, visual changes, nausea, vomiting, diarrhea, constipation, dizziness, abdominal pain, skin rash, fevers, chills, night sweats, weight loss, swollen lymph nodes, body aches, joint swelling, muscle aches, chest pain, shortness of breath, mood changes.    Objective  Blood pressure 128/64, pulse 74, height 5\' 8"  (1.727 m), weight 172 lb 12.8 oz (78.4 kg).   Systems examined below as of 03/31/16 General: NAD A&O x3 mood, affect normal  HEENT: Pupils equal, extraocular movements intact no nystagmus Respiratory: not short of breath at rest or with speaking Cardiovascular: No lower extremity edema, non tender Skin: Warm dry intact with no signs of infection or rash on extremities  or on axial skeleton. Abdomen: Soft nontender, no masses Neuro: Cranial nerves  intact, neurovascularly intact in all extremities with 2+ DTRs and 2+ pulses. Lymph: No lymphadenopathy appreciated today  Gait normal with good balance and coordination.  MSK: Non tender with full range of motion and good stability and symmetric strength and tone of shoulders, elbows, wrist,  knee and ankles bilaterally.   [id number] ROM IR: 5 Deg with more pain, ER: 75 Deg, Flexion: 120 Deg, Extension: 80 Deg, Abduction: 45 Deg, Adduction: 15 Deg this have increased range of motion compared to contralateral side Strength IR: 5/5, ER: 5/5, Flexion: 5/5, Extension: 5/5, Abduction: 5/5, Adduction:  5/5 Pelvic alignment unremarkable to inspection and palpation. No longer tender over the greater trochanter area. Patient does have a positive grind test and does have positive pain with internal rotation of the hip. Contralateral lateral hip fairly unremarkable. Mild increase in range of motion     Impression and Recommendations:     This case required medical decision making of moderate complexity.      Note: This dictation was prepared with Dragon dictation along with smaller phrase technology. Any transcriptional errors that result from this process are unintentional.

## 2016-04-26 ENCOUNTER — Encounter: Payer: Self-pay | Admitting: Emergency Medicine

## 2016-04-26 ENCOUNTER — Ambulatory Visit (INDEPENDENT_AMBULATORY_CARE_PROVIDER_SITE_OTHER): Payer: BC Managed Care – PPO | Admitting: Emergency Medicine

## 2016-04-26 VITALS — BP 94/70 | HR 80 | Temp 98.1°F | Resp 18 | Ht 68.0 in | Wt 177.0 lb

## 2016-04-26 DIAGNOSIS — J069 Acute upper respiratory infection, unspecified: Secondary | ICD-10-CM | POA: Diagnosis not present

## 2016-04-26 DIAGNOSIS — J040 Acute laryngitis: Secondary | ICD-10-CM | POA: Diagnosis not present

## 2016-04-26 MED ORDER — PROMETHAZINE-CODEINE 6.25-10 MG/5ML PO SYRP: 5.0000 mL | ORAL_SOLUTION | Freq: Every evening | ORAL | 0 refills | Status: AC | PRN

## 2016-04-26 MED ORDER — PREDNISONE 20 MG PO TABS: 40.0000 mg | ORAL_TABLET | Freq: Every day | ORAL | 0 refills | Status: AC

## 2016-04-26 MED ORDER — AZITHROMYCIN 250 MG PO TABS: ORAL_TABLET | ORAL | 0 refills | Status: DC

## 2016-04-26 NOTE — Progress Notes (Signed)
Laurie Lewis 41 y.o.   Chief Complaint  Patient presents with  . Sore Throat  . Cough    HISTORY OF PRESENT ILLNESS: This is a 41 y.o. female complaining of cough, flu symptoms for several days followed today by loss of voice.  Sore Throat   This is a new problem. The current episode started in the past 7 days. The problem has been gradually worsening. There has been no fever. The pain is at a severity of 2/10. The pain is mild. Associated symptoms include congestion, coughing and a hoarse voice. Pertinent negatives include no abdominal pain, diarrhea, ear pain, headaches, neck pain, shortness of breath, stridor, swollen glands, trouble swallowing or vomiting. She has tried cool liquids and acetaminophen for the symptoms. The treatment provided mild relief.     Prior to Admission medications   Medication Sig Start Date End Date Taking? Authorizing Provider  letrozole (FEMARA) 2.5 MG tablet Take 2.5 mg by mouth daily.   Yes Historical Provider, MD  norethindrone-ethinyl estradiol (FEMHRT LOW DOSE) 0.5-2.5 MG-MCG tablet Take 1 tablet by mouth daily.   Yes Historical Provider, MD  azithromycin (ZITHROMAX) 250 MG tablet Sig as indicated 04/26/16   [employer], MD  ibuprofen (ADVIL,MOTRIN) 800 MG tablet Take 1 tablet (800 mg total) by mouth every 6 (six) hours as needed. Patient not taking: Reported on 03/31/2016 06/18/14   Roselee Culver, MD  predniSONE (DELTASONE) 20 MG tablet Take 2 tablets (40 mg total) by mouth daily with breakfast. 04/26/16 05/01/16  Horald Pollen, MD  promethazine-codeine [facility] WITH CODEINE) 6.25-10 MG/5ML syrup Take 5 mLs by mouth at bedtime as needed for cough. 04/26/16 04/29/16  Horald Pollen, MD    Allergies  Allergen Reactions  . Benadryl [Diphenhydramine] Other (See Comments)    "Hypes her up"  . Epinephrine Other (See Comments)    "Hypes her up"--patient doesn't tolerate well    Patient Active Problem List   Diagnosis Date Noted    . Left hip pain 02/28/2016  . PEPTIC ULCER DISEASE 01/31/2010    Past Medical History:  Diagnosis Date  . Anxiety   . Blood transfusion without reported diagnosis    bleeding ?peptic ulcer at age 32  . Depression   . Dysmenorrhea   . Eczema   . Fibroid   . Infertility, female   . Peptic ulcer disease   . STD (sexually transmitted disease)    HSV I, Pos. Chlamydia    Past Surgical History:  Procedure Laterality Date  . WISDOM TOOTH EXTRACTION      Social History   Social History  . Marital status: Divorced    Spouse name: N/A  . Number of children: N/A  . Years of education: N/A   Occupational History  . Not on file.   Social History Main Topics  . Smoking status: Current Every Day Smoker    Packs/day: 0.50    Years: 20.00    Types: Cigarettes    Last attempt to quit: 06/28/2009  . Smokeless tobacco: Never Used  . Alcohol use 3.0 oz/week    5 Standard drinks or equivalent per week  . Drug use: No  . Sexual activity: Yes    Partners: Female, Female    Birth control/ protection: Condom     Comment: current partner female(2016)   Other Topics Concern  . Not on file   Social History Narrative  . No narrative on file    Family History  Problem Relation Age of  Onset  . Breast cancer Mother 63  . Diabetes Mother   . Hypertension Mother   . Stroke Mother   . Cancer Father 68    dec--old age at 32  . Diabetes Father   . Stroke Father   . Breast cancer Maternal Grandmother 31    dec  . Diabetes Paternal Grandfather      Review of Systems  Constitutional: Positive for chills and malaise/fatigue. Negative for diaphoresis and fever.  HENT: Positive for congestion, hoarse voice and sore throat. Negative for ear pain, nosebleeds, sinus pain and trouble swallowing.   Eyes: Negative for discharge and redness.  Respiratory: Positive for cough. Negative for shortness of breath and stridor.   Cardiovascular: Negative for chest pain, palpitations and leg swelling.   Gastrointestinal: Negative for abdominal pain, diarrhea and vomiting.  Genitourinary: Negative for dysuria and hematuria.  Musculoskeletal: Negative for neck pain.  Skin: Negative for rash.  Neurological: Negative for dizziness and headaches.  Endo/Heme/Allergies: Does not bruise/bleed easily.  All other systems reviewed and are negative.  Vitals:   04/26/16 1637  BP: 94/70  Pulse: 80  Resp: 18  Temp: 98.1 F (36.7 C)     Physical Exam  Constitutional: She is oriented to person, place, and time. She appears well-developed and well-nourished.  HENT:  Head: Normocephalic and atraumatic.  Nose: Nose normal.  Mouth/Throat: Oropharynx is clear and moist. No oropharyngeal exudate.  Eyes: Conjunctivae and EOM are normal. Pupils are equal, round, and reactive to light.  Neck: Normal range of motion. Neck supple. No JVD present. No thyromegaly present.  Cardiovascular: Normal rate, regular rhythm and normal heart sounds.   Pulmonary/Chest: Effort normal and breath sounds normal. She has no wheezes. She has no rales.  Abdominal: Soft. She exhibits no distension. There is no tenderness.  Musculoskeletal: Normal range of motion.  Lymphadenopathy:    She has no cervical adenopathy.  Neurological: She is alert and oriented to person, place, and time. No sensory deficit. She exhibits normal muscle tone.  Skin: Skin is warm and dry. Capillary refill takes less than 2 seconds.  Psychiatric: She has a normal mood and affect. Her behavior is normal.  Vitals reviewed.    ASSESSMENT & PLAN: Laurie Lewis was seen today for sore throat and cough.  Diagnoses and all orders for this visit:  Acute laryngitis  Acute upper respiratory infection  Other orders -     Cancel: Flu Vaccine QUAD 36+ mos IM -     azithromycin (ZITHROMAX) 250 MG tablet; Sig as indicated -     predniSONE (DELTASONE) 20 MG tablet; Take 2 tablets (40 mg total) by mouth daily with breakfast. -     promethazine-codeine  (PHENERGAN WITH CODEINE) 6.25-10 MG/5ML syrup; Take 5 mLs by mouth at bedtime as needed for cough.    Patient Instructions  Laryngitis Introduction Laryngitis is swelling (inflammation) of your vocal cords. This causes hoarseness, coughing, loss of voice, sore throat, or a dry throat. When your vocal cords are inflamed, your voice sounds different. Laryngitis can be temporary (acute) or long-term (chronic). Most cases of acute laryngitis improve with time. Chronic laryngitis is laryngitis that lasts for more than three weeks. Follow these instructions at home:  Drink enough fluid to keep your pee (urine) clear or pale yellow.  Breathe in moist air. Use a humidifier if you live in a dry climate.  Take medicines only as told by your doctor.  Do not smoke cigarettes or electronic cigarettes. If you need help  quitting, ask your doctor.  Talk as little as possible. Also avoid whispering, which can cause vocal strain.  Write instead of talking. Do this until your voice is back to normal. Contact a doctor if:  You have a fever.  Your pain is worse.  You have trouble swallowing. Get help right away if:  You cough up blood.  You have trouble breathing. This information is not intended to replace advice given to you by your health care provider. Make sure you discuss any questions you have with your health care provider. Document Released: 03/02/2011 Document Revised: 08/19/2015 Document Reviewed: 08/26/2013  2017 Elsevier      Agustina Caroli, MD [facility]

## 2016-04-26 NOTE — Patient Instructions (Signed)
Laryngitis Introduction Laryngitis is swelling (inflammation) of your vocal cords. This causes hoarseness, coughing, loss of voice, sore throat, or a dry throat. When your vocal cords are inflamed, your voice sounds different. Laryngitis can be temporary (acute) or long-term (chronic). Most cases of acute laryngitis improve with time. Chronic laryngitis is laryngitis that lasts for more than three weeks. Follow these instructions at home:  Drink enough fluid to keep your pee (urine) clear or pale yellow.  Breathe in moist air. Use a humidifier if you live in a dry climate.  Take medicines only as told by your doctor.  Do not smoke cigarettes or electronic cigarettes. If you need help quitting, ask your doctor.  Talk as little as possible. Also avoid whispering, which can cause vocal strain.  Write instead of talking. Do this until your voice is back to normal. Contact a doctor if:  You have a fever.  Your pain is worse.  You have trouble swallowing. Get help right away if:  You cough up blood.  You have trouble breathing. This information is not intended to replace advice given to you by your health care provider. Make sure you discuss any questions you have with your health care provider. Document Released: 03/02/2011 Document Revised: 08/19/2015 Document Reviewed: 08/26/2013  2017 Elsevier

## 2016-08-29 ENCOUNTER — Other Ambulatory Visit: Payer: Self-pay | Admitting: Obstetrics and Gynecology

## 2016-08-29 DIAGNOSIS — D250 Submucous leiomyoma of uterus: Secondary | ICD-10-CM

## 2016-09-13 ENCOUNTER — Encounter (HOSPITAL_COMMUNITY): Payer: Self-pay | Admitting: *Deleted

## 2016-09-19 ENCOUNTER — Ambulatory Visit
Admission: RE | Admit: 2016-09-19 | Discharge: 2016-09-19 | Disposition: A | Discharge status: Home / Self Care | Payer: BC Managed Care – PPO | Source: Ambulatory Visit | Attending: Obstetrics and Gynecology | Admitting: Obstetrics and Gynecology

## 2016-09-19 ENCOUNTER — Other Ambulatory Visit: Payer: Self-pay

## 2016-09-19 ENCOUNTER — Other Ambulatory Visit (HOSPITAL_COMMUNITY): Payer: Self-pay | Admitting: Interventional Radiology

## 2016-09-19 ENCOUNTER — Other Ambulatory Visit: Payer: Self-pay | Admitting: Obstetrics and Gynecology

## 2016-09-19 ENCOUNTER — Other Ambulatory Visit: Payer: Self-pay | Admitting: *Deleted

## 2016-09-19 DIAGNOSIS — D250 Submucous leiomyoma of uterus: Secondary | ICD-10-CM

## 2016-09-19 HISTORY — PX: IR RADIOLOGIST EVAL & MGMT: IMG5224

## 2016-09-19 HISTORY — DX: Excessive and frequent menstruation with regular cycle: N92.0

## 2016-09-19 NOTE — Consult Note (Signed)
Chief Complaint: Patient was seen in consultation today for  Chief Complaint  Patient presents with  . Advice Only    Consult for [place]     at the request of Sherlyn Hay  Referring Physician(s): Banga,Cecilia Worema  History of Present Illness: Laurie Lewis is a 41 y.o. female who had crampy pelvic pain and was diagnosed with endometriosis approximately 10 years ago, and subsequently was diagnosed in [place], [place] with uterine fibroids  by ultrasound in 2010. She had been on progestins for 2 years which had helped her health her menstrual cycles. She was on Lystedafor 1 month which help with the heavy bleeding. She is recently had very long and heavy menstrual cycles every 3-4 weeks lasting 5-7 days with 4 days of heavy bleeding including clots, requiring changing her Every hour. She does not have any interperiod bleeding. She does describe dysmenorrhea. No significant bulk symptoms however. No previous fibroid surgery. Only previous gynecologic infection Chlamydia. Recent Pap smear was negative. Recent endometrial biopsy showed no evidence for atypia or malignancy. She is scheduled for endometrial ablation tomorrow. She does not have periods anymore. She is G0 P0 with no plans for future pregnancy.   Past Medical History:  Diagnosis Date  . Anxiety   . Blood transfusion without reported diagnosis    bleeding ?peptic ulcer at age 20  . Depression   . Dysmenorrhea   . Dysmenorrhea   . Dysmenorrhea   . Eczema   . Fibroid   . Infertility, female   . Menorrhagia   . Peptic ulcer disease   . STD (sexually transmitted disease)    HSV I, Pos. Chlamydia    Past Surgical History:  Procedure Laterality Date  . WISDOM TOOTH EXTRACTION      Allergies: Benadryl [diphenhydramine] and Epinephrine  Medications: Prior to Admission medications   Medication Sig Start Date End Date Taking? Authorizing Provider  ibuprofen (ADVIL,MOTRIN) 200 MG tablet Take 200-600 mg by mouth  every 8 (eight) hours as needed (for pain.).   Yes [provider]  tranexamic acid (LYSTEDA) 650 MG TABS tablet Take 1,300 mg by mouth 2 (two) times daily as needed.   Yes [provider]  azithromycin (ZITHROMAX) 250 MG tablet Sig as indicated Patient not taking: Reported on 09/15/2016 04/26/16   Horald Pollen, MD     Family History  Problem Relation Age of Onset  . Breast cancer Mother 51  . Diabetes Mother   . Hypertension Mother   . Stroke Mother   . Cancer Father 62       dec--old age at 5  . Diabetes Father   . Stroke Father   . Breast cancer Maternal Grandmother 78       dec  . Diabetes Paternal Grandfather     Social History   Social History  . Marital status: Divorced    Spouse name: N/A  . Number of children: N/A  . Years of education: N/A   Social History Main Topics  . Smoking status: Current Every Day Smoker    Packs/day: 0.50    Years: 20.00    Types: Cigarettes    Last attempt to quit: 06/28/2009  . Smokeless tobacco: Never Used  . Alcohol use 3.0 oz/week    5 Standard drinks or equivalent per week     Comment: occas  . Drug use: No  . Sexual activity: Yes    Partners: Female, Female    Birth control/ protection: Condom  Comment: current partner female(2016)   Other Topics Concern  . Not on file   Social History Narrative  . 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  ECOG Status: 1 - Symptomatic but completely ambulatory  Review of Systems: A 12 point ROS discussed and pertinent positives are indicated in the HPI above.  All other systems are negative.  Review of Systems    Physical Exam Vital Signs: BP 104/64   Pulse (!) 51   Temp 97.8 F (36.6 C) (Oral)   Resp 14   Ht 5\' 8"  (1.727 m)   Wt 162 lb (73.5 kg)   SpO2 100%   BMI 24.63 kg/m   Constitutional: Oriented to person, place, and time. Well-developed and well-nourished. No distress.  HENT:  Head: Normocephalic and atraumatic.  Eyes:  Conjunctivae and EOM are normal. Right eye exhibits no discharge. Left eye exhibits no discharge. No scleral icterus.  Neck: No JVD present.  Pulmonary/Chest: Effort normal. No stridor. No respiratory distress.  Abdomen: soft, non distended Neurological:  alert and oriented to person, place, and time.  Skin: Skin is warm and dry.  not diaphoretic.  Psychiatric:   normal mood and affect.   behavior is normal. Judgment and thought content normal.   Mallampati Score:     Imaging: No results found.  Labs:  CBC: No results for input(s): WBC, HGB, HCT, PLT in the last 8760 hours.  COAGS: No results for input(s): INR, APTT in the last 8760 hours.  BMP: No results for input(s): NA, K, CL, CO2, GLUCOSE, BUN, CALCIUM, CREATININE, GFRNONAA, GFRAA in the last 8760 hours.  Invalid input(s): CMP  LIVER FUNCTION TESTS: No results for input(s): BILITOT, AST, ALT, ALKPHOS, PROT, ALBUMIN in the last 8760 hours.  TUMOR MARKERS: No results for input(s): AFPTM, CEA, CA199, CHROMGRNA in the last 8760 hours.  Assessment and Plan:  My impression is that this patient's menorrhagia may well be related to her previously diagnosed uterine fibroids resulting in significant distortion of the endometrial cavity. This may make her a poor candidate for endometrial ablation. I think it would be useful to get a pelvic MR to better delineate the exact site of her uterine fibroids and the condition of her endometrial cavity to determine what might be the best treatment option for her menorrhagia. This would also help exclude any pedunculated fibroids on a narrow stalk, a relative contraindication to fibroid embolization.  We discussed the pathophysiology of uterine fibroids. We discussed treatment options including watchful waiting, hysterectomy, endometrial ablation, and uterine fibroid embolization. We reviewed in detail the UFE technique, anticipated benefits, time course of symptom resolution, possible risks  and complications. She seemed to understand, was well-informed, and did ask appropriate questions which were answered. She is motivated to proceed.  We will go ahead and schedule pelvic MRI with contrast. If the endometrial cavity is relatively unremarkable, she should probably proceed with individual ablation as the first step. If there is significant distortion by multiple submucosal fibroids, we can go ahead with uterine fibroid embolization.   Thank you for this interesting consult.  I greatly enjoyed meeting Joeann Steppe and look forward to participating in their care.  A copy of this report was sent to the requesting provider on this date.  Electronically Signed: Gwendolin Briel III, DAYNE Crescentia Boutwell 09/19/2016, 9:52 AM   I spent a total of  40 Minutes   in face to face in clinical consultation, greater than 50% of which was counseling/coordinating care  for menorrhagia and uterine fibroids.

## 2016-09-20 ENCOUNTER — Ambulatory Visit (HOSPITAL_COMMUNITY)
Admission: RE | Admit: 2016-09-20 | Discharge: 2016-09-20 | Disposition: A | Discharge status: Home / Self Care | Payer: BC Managed Care – PPO | Source: Ambulatory Visit | Attending: Interventional Radiology | Admitting: Interventional Radiology

## 2016-09-20 ENCOUNTER — Ambulatory Visit (HOSPITAL_COMMUNITY)
Admission: RE | Admit: 2016-09-20 | Payer: BC Managed Care – PPO | Source: Ambulatory Visit | Admitting: Obstetrics and Gynecology

## 2016-09-20 DIAGNOSIS — N800 Endometriosis of uterus: Secondary | ICD-10-CM | POA: Diagnosis not present

## 2016-09-20 DIAGNOSIS — N83201 Unspecified ovarian cyst, right side: Secondary | ICD-10-CM | POA: Diagnosis not present

## 2016-09-20 DIAGNOSIS — D250 Submucous leiomyoma of uterus: Secondary | ICD-10-CM | POA: Diagnosis present

## 2016-09-20 DIAGNOSIS — N838 Other noninflammatory disorders of ovary, fallopian tube and broad ligament: Secondary | ICD-10-CM | POA: Insufficient documentation

## 2016-09-20 SURGERY — DILATATION & CURETTAGE/HYSTEROSCOPY WITH NOVASURE ABLATION
Anesthesia: Choice

## 2016-09-20 MED ORDER — GADOBENATE DIMEGLUMINE 529 MG/ML IV SOLN
20.0000 mL | Freq: Once | INTRAVENOUS | Status: AC | PRN
  Administered 2016-09-20: 16 mL via INTRAVENOUS

## 2016-09-28 ENCOUNTER — Encounter (HOSPITAL_BASED_OUTPATIENT_CLINIC_OR_DEPARTMENT_OTHER): Payer: Self-pay | Admitting: *Deleted

## 2016-10-02 NOTE — H&P (Signed)
Laurie Lewis is an 41 y.o. female G29 with hx of fibroids, endometriosis with menorrhagia and dysmenorrhea. Pt has tried lysteda but requests surgical mgmt. She had a consult with interventional radiology and an mri to confirm location of fibroids. Pt deemed good candidate for ablation. She has been counseled extensively aboutu hysteroscopy with D&C, possible myosure, and novasure ablation with r/b and alternatives such as UFE reviewed in depth. Pt consents for procedure after all questions answered.    Pertinent Gynecological History: Menses: with severe dysmenorrhea Bleeding: menorrhagia Contraception: abstinence DES exposure: denies Blood transfusions: none Sexually transmitted diseases: no past history Previous GYN Procedures: none  Last mammogram: normal Date: 02/2016 Last pap: normal Date: 06/2014 OB History: G0  Menstrual History: Menarche age: 36 No LMP recorded. Patient is not currently having periods (Reason: Oral contraceptives).    Past Medical History:  Diagnosis Date  . Anxiety   . Depression   . Dysmenorrhea   . Eczema   . History of bleeding peptic ulcer    age 66--    . History of chlamydia   . History of endometriosis 2008  . History of uterine fibroid   . Infertility, female   . Menorrhagia     Past Surgical History:  Procedure Laterality Date  . WISDOM TOOTH EXTRACTION      Family History  Problem Relation Age of Onset  . Breast cancer Mother 39  . Diabetes Mother   . Hypertension Mother   . Stroke Mother   . Cancer Father 41       dec--old age at 75  . Diabetes Father   . Stroke Father   . Breast cancer Maternal Grandmother 62       dec  . Diabetes Paternal Grandfather     Social History:  reports that she has been smoking Cigarettes.  She has a 10.00 pack-year smoking history. She has never used smokeless tobacco. She reports that she drinks about 3.0 oz of alcohol per week . She reports that she does not use drugs.  Allergies:  Allergies   Allergen Reactions  . Benadryl [Diphenhydramine] Other (See Comments)    "Hypes her up"  . Epinephrine Other (See Comments)    "Hypes her up"--patient doesn't tolerate well    No prescriptions prior to admission.    Review of Systems  Constitutional: Negative for chills, fever, malaise/fatigue and weight loss.  Eyes: Negative for blurred vision.  Respiratory: Negative for shortness of breath.   Cardiovascular: Negative for chest pain.  Gastrointestinal: Negative for abdominal pain, heartburn, nausea and vomiting.  Genitourinary: Negative for dysuria.  Musculoskeletal: Negative for back pain.  Skin: Negative for itching and rash.  Neurological: Negative for dizziness.  Endo/Heme/Allergies: Does not bruise/bleed easily.  Psychiatric/Behavioral: Negative for depression, hallucinations, substance abuse and suicidal ideas. The patient is not nervous/anxious.     There were no vitals taken for this visit. Physical Exam  Constitutional: She is oriented to person, place, and time. She appears well-developed and well-nourished.  Neck: Normal range of motion.  Cardiovascular: Normal rate.   Respiratory: Effort normal.  GI: Soft.  Genitourinary: Uterus normal.  Genitourinary Comments: Fibroid uterus  Musculoskeletal: Normal range of motion.  Neurological: She is alert and oriented to person, place, and time.  Skin: Skin is warm.  Psychiatric: She has a normal mood and affect. Her behavior is normal. Judgment and thought content normal.    No results found for this or any previous visit (from the past 24 hour(s)).  No  results found.  Assessment/Plan: G0 with menorrhagia and dysmenorrhea requesting hysteroscopy with D&C, possible myosure, and novasure ablation R/B and alternatives reviewed  All questions answered Consent verified To OR when ready  Philip Eckersley W Jayne Peckenpaugh 10/02/2016, 10:13 PM

## 2016-10-03 ENCOUNTER — Encounter (HOSPITAL_BASED_OUTPATIENT_CLINIC_OR_DEPARTMENT_OTHER): Payer: Self-pay | Admitting: *Deleted

## 2016-10-03 NOTE — Progress Notes (Addendum)
NPO AFTER MN.  ARRIVE AT 0745.  NEEDS URINE PREG.  GETTING CBC DONE PRIOR TO DOS.    ADDENDUM:  CALLED AND SPOKE W/ DR Terri Piedra VIA PHONE.  ASKED IF T&S NEEDED FOR THIS PROCEDURE.  DR Terri Piedra STATED SHE DOES T&S WITH ALL ARE PATIENTS AND PROCEDURES..  PT WILL NEED T&S ON ARRIVAL .

## 2016-10-04 DIAGNOSIS — D259 Leiomyoma of uterus, unspecified: Secondary | ICD-10-CM | POA: Diagnosis not present

## 2016-10-04 DIAGNOSIS — Z888 Allergy status to other drugs, medicaments and biological substances status: Secondary | ICD-10-CM | POA: Diagnosis not present

## 2016-10-04 DIAGNOSIS — F329 Major depressive disorder, single episode, unspecified: Secondary | ICD-10-CM | POA: Diagnosis not present

## 2016-10-04 DIAGNOSIS — F419 Anxiety disorder, unspecified: Secondary | ICD-10-CM | POA: Diagnosis not present

## 2016-10-04 DIAGNOSIS — N946 Dysmenorrhea, unspecified: Secondary | ICD-10-CM | POA: Diagnosis not present

## 2016-10-04 DIAGNOSIS — N854 Malposition of uterus: Secondary | ICD-10-CM | POA: Diagnosis not present

## 2016-10-04 DIAGNOSIS — Z8711 Personal history of peptic ulcer disease: Secondary | ICD-10-CM | POA: Diagnosis not present

## 2016-10-04 DIAGNOSIS — N979 Female infertility, unspecified: Secondary | ICD-10-CM | POA: Diagnosis not present

## 2016-10-04 DIAGNOSIS — N920 Excessive and frequent menstruation with regular cycle: Secondary | ICD-10-CM | POA: Diagnosis not present

## 2016-10-04 DIAGNOSIS — F17210 Nicotine dependence, cigarettes, uncomplicated: Secondary | ICD-10-CM | POA: Diagnosis not present

## 2016-10-04 LAB — CBC
HCT: 38.8 % (ref 36.0–46.0)
Hemoglobin: 13.5 g/dL (ref 12.0–15.0)
MCH: 31.5 pg (ref 26.0–34.0)
MCHC: 34.8 g/dL (ref 30.0–36.0)
MCV: 90.4 fL (ref 78.0–100.0)
PLATELETS: 259 10*3/uL (ref 150–400)
RBC: 4.29 MIL/uL (ref 3.87–5.11)
RDW: 12.3 % (ref 11.5–15.5)
WBC: 6.5 10*3/uL (ref 4.0–10.5)

## 2016-10-10 ENCOUNTER — Encounter (HOSPITAL_BASED_OUTPATIENT_CLINIC_OR_DEPARTMENT_OTHER)
Admission: RE | Disposition: A | Discharge status: Home / Self Care | Payer: Self-pay | Source: Ambulatory Visit | Attending: Obstetrics and Gynecology

## 2016-10-10 ENCOUNTER — Encounter (HOSPITAL_BASED_OUTPATIENT_CLINIC_OR_DEPARTMENT_OTHER): Payer: Self-pay | Admitting: Certified Registered"

## 2016-10-10 ENCOUNTER — Ambulatory Visit (HOSPITAL_BASED_OUTPATIENT_CLINIC_OR_DEPARTMENT_OTHER)
Admission: RE | Admit: 2016-10-10 | Discharge: 2016-10-10 | Disposition: A | Discharge status: Home / Self Care | Payer: BC Managed Care – PPO | Source: Ambulatory Visit | Attending: Obstetrics and Gynecology | Admitting: Obstetrics and Gynecology

## 2016-10-10 ENCOUNTER — Ambulatory Visit (HOSPITAL_BASED_OUTPATIENT_CLINIC_OR_DEPARTMENT_OTHER): Payer: BC Managed Care – PPO | Admitting: Certified Registered"

## 2016-10-10 DIAGNOSIS — Z9889 Other specified postprocedural states: Secondary | ICD-10-CM

## 2016-10-10 DIAGNOSIS — Z8711 Personal history of peptic ulcer disease: Secondary | ICD-10-CM | POA: Insufficient documentation

## 2016-10-10 DIAGNOSIS — N920 Excessive and frequent menstruation with regular cycle: Secondary | ICD-10-CM

## 2016-10-10 DIAGNOSIS — N946 Dysmenorrhea, unspecified: Secondary | ICD-10-CM

## 2016-10-10 DIAGNOSIS — F329 Major depressive disorder, single episode, unspecified: Secondary | ICD-10-CM | POA: Insufficient documentation

## 2016-10-10 DIAGNOSIS — N809 Endometriosis, unspecified: Secondary | ICD-10-CM

## 2016-10-10 DIAGNOSIS — N979 Female infertility, unspecified: Secondary | ICD-10-CM | POA: Insufficient documentation

## 2016-10-10 DIAGNOSIS — D259 Leiomyoma of uterus, unspecified: Secondary | ICD-10-CM | POA: Insufficient documentation

## 2016-10-10 DIAGNOSIS — D219 Benign neoplasm of connective and other soft tissue, unspecified: Secondary | ICD-10-CM

## 2016-10-10 DIAGNOSIS — F419 Anxiety disorder, unspecified: Secondary | ICD-10-CM | POA: Insufficient documentation

## 2016-10-10 DIAGNOSIS — N854 Malposition of uterus: Secondary | ICD-10-CM | POA: Insufficient documentation

## 2016-10-10 DIAGNOSIS — Z888 Allergy status to other drugs, medicaments and biological substances status: Secondary | ICD-10-CM | POA: Insufficient documentation

## 2016-10-10 DIAGNOSIS — F17210 Nicotine dependence, cigarettes, uncomplicated: Secondary | ICD-10-CM | POA: Insufficient documentation

## 2016-10-10 HISTORY — DX: Personal history of other infectious and parasitic diseases: Z86.19

## 2016-10-10 HISTORY — DX: Personal history of other diseases of the female genital tract: Z87.42

## 2016-10-10 HISTORY — DX: Personal history of other benign neoplasm: Z86.018

## 2016-10-10 HISTORY — DX: Personal history of peptic ulcer disease: Z87.11

## 2016-10-10 HISTORY — PX: DILITATION & CURRETTAGE/HYSTROSCOPY WITH NOVASURE ABLATION: SHX5568

## 2016-10-10 LAB — ABO/RH: ABO/RH(D): O POS

## 2016-10-10 LAB — TYPE AND SCREEN
ABO/RH(D): O POS
Antibody Screen: NEGATIVE

## 2016-10-10 LAB — POCT PREGNANCY, URINE: Preg Test, Ur: NEGATIVE

## 2016-10-10 SURGERY — DILATATION & CURETTAGE/HYSTEROSCOPY WITH NOVASURE ABLATION
Anesthesia: General

## 2016-10-10 MED ORDER — FENTANYL CITRATE (PF) 100 MCG/2ML IJ SOLN
INTRAMUSCULAR | Status: AC
  Filled 2016-10-10: qty 2

## 2016-10-10 MED ORDER — OXYCODONE HCL 5 MG PO TABS
5.0000 mg | ORAL_TABLET | Freq: Once | ORAL | Status: AC
  Administered 2016-10-10: 5 mg via ORAL
  Filled 2016-10-10: qty 1

## 2016-10-10 MED ORDER — LIDOCAINE HCL 1 % IJ SOLN
INTRAMUSCULAR | Status: DC | PRN
  Administered 2016-10-10: 7 mL

## 2016-10-10 MED ORDER — GLYCOPYRROLATE 0.2 MG/ML IV SOSY
PREFILLED_SYRINGE | INTRAVENOUS | Status: AC
  Filled 2016-10-10: qty 5

## 2016-10-10 MED ORDER — DEXAMETHASONE SODIUM PHOSPHATE 4 MG/ML IJ SOLN
INTRAMUSCULAR | Status: DC | PRN
  Administered 2016-10-10: 10 mg via INTRAVENOUS

## 2016-10-10 MED ORDER — PROPOFOL 10 MG/ML IV BOLUS
INTRAVENOUS | Status: DC | PRN
  Administered 2016-10-10: 180 mg via INTRAVENOUS

## 2016-10-10 MED ORDER — MEPERIDINE HCL 25 MG/ML IJ SOLN
6.2500 mg | INTRAMUSCULAR | Status: DC | PRN
  Filled 2016-10-10: qty 1

## 2016-10-10 MED ORDER — LACTATED RINGERS IV SOLN
INTRAVENOUS | Status: DC
  Administered 2016-10-10: 09:00:00 via INTRAVENOUS
  Filled 2016-10-10: qty 1000

## 2016-10-10 MED ORDER — LACTATED RINGERS IV SOLN
INTRAVENOUS | Status: DC
  Filled 2016-10-10: qty 1000

## 2016-10-10 MED ORDER — GLYCOPYRROLATE 0.2 MG/ML IV SOSY
PREFILLED_SYRINGE | INTRAVENOUS | Status: DC | PRN
  Administered 2016-10-10: .2 mg via INTRAVENOUS

## 2016-10-10 MED ORDER — LIDOCAINE 2% (20 MG/ML) 5 ML SYRINGE
INTRAMUSCULAR | Status: DC | PRN
  Administered 2016-10-10: 60 mg via INTRAVENOUS

## 2016-10-10 MED ORDER — DEXAMETHASONE SODIUM PHOSPHATE 10 MG/ML IJ SOLN
INTRAMUSCULAR | Status: AC
  Filled 2016-10-10: qty 1

## 2016-10-10 MED ORDER — FENTANYL CITRATE (PF) 100 MCG/2ML IJ SOLN
25.0000 ug | INTRAMUSCULAR | Status: DC | PRN
  Administered 2016-10-10: 25 ug via INTRAVENOUS
  Filled 2016-10-10: qty 1

## 2016-10-10 MED ORDER — MIDAZOLAM HCL 5 MG/5ML IJ SOLN
INTRAMUSCULAR | Status: DC | PRN
  Administered 2016-10-10: 2 mg via INTRAVENOUS

## 2016-10-10 MED ORDER — KETOROLAC TROMETHAMINE 30 MG/ML IJ SOLN
30.0000 mg | Freq: Once | INTRAMUSCULAR | Status: DC
  Filled 2016-10-10: qty 1

## 2016-10-10 MED ORDER — KETOROLAC TROMETHAMINE 30 MG/ML IJ SOLN
INTRAMUSCULAR | Status: AC
  Filled 2016-10-10: qty 1

## 2016-10-10 MED ORDER — FENTANYL CITRATE (PF) 100 MCG/2ML IJ SOLN
INTRAMUSCULAR | Status: DC | PRN
  Administered 2016-10-10: 50 ug via INTRAVENOUS
  Administered 2016-10-10 (×2): 25 ug via INTRAVENOUS

## 2016-10-10 MED ORDER — OXYCODONE HCL 5 MG PO TABS
ORAL_TABLET | ORAL | Status: AC
  Filled 2016-10-10: qty 1

## 2016-10-10 MED ORDER — ONDANSETRON HCL 4 MG/2ML IJ SOLN
INTRAMUSCULAR | Status: DC | PRN
  Administered 2016-10-10: 4 mg via INTRAVENOUS

## 2016-10-10 MED ORDER — METOCLOPRAMIDE HCL 5 MG/ML IJ SOLN
10.0000 mg | Freq: Once | INTRAMUSCULAR | Status: DC | PRN
  Filled 2016-10-10: qty 2

## 2016-10-10 MED ORDER — MIDAZOLAM HCL 2 MG/2ML IJ SOLN
INTRAMUSCULAR | Status: AC
  Filled 2016-10-10: qty 2

## 2016-10-10 MED ORDER — WHITE PETROLATUM GEL
Status: AC
  Filled 2016-10-10: qty 5

## 2016-10-10 MED ORDER — ONDANSETRON HCL 4 MG/2ML IJ SOLN
INTRAMUSCULAR | Status: AC
  Filled 2016-10-10: qty 2

## 2016-10-10 MED ORDER — LIDOCAINE 2% (20 MG/ML) 5 ML SYRINGE
INTRAMUSCULAR | Status: AC
  Filled 2016-10-10: qty 5

## 2016-10-10 MED ORDER — PROPOFOL 10 MG/ML IV BOLUS
INTRAVENOUS | Status: AC
  Filled 2016-10-10: qty 20

## 2016-10-10 MED ORDER — KETOROLAC TROMETHAMINE 30 MG/ML IJ SOLN
INTRAMUSCULAR | Status: DC | PRN
  Administered 2016-10-10: 30 mg via INTRAVENOUS

## 2016-10-10 SURGICAL SUPPLY — 36 items
ABLATOR ENDOMETRIAL BIPOLAR (ABLATOR) ×3 IMPLANT
CANISTER SUCT 3000ML PPV (MISCELLANEOUS) ×3 IMPLANT
CATH ROBINSON RED A/P 16FR (CATHETERS) ×3 IMPLANT
CLOTH BEACON ORANGE TIMEOUT ST (SAFETY) ×3 IMPLANT
CONTAINER PREFILL 10% NBF 60ML (FORM) ×6 IMPLANT
COVER BACK TABLE 60X90IN (DRAPES) ×3 IMPLANT
DEVICE MYOSURE LITE (MISCELLANEOUS) IMPLANT
DEVICE MYOSURE REACH (MISCELLANEOUS) IMPLANT
DILATOR CANAL MILEX (MISCELLANEOUS) IMPLANT
DRAPE HYSTEROSCOPY (DRAPE) ×3 IMPLANT
DRAPE LG THREE QUARTER DISP (DRAPES) ×3 IMPLANT
DRSG TELFA 3X8 NADH (GAUZE/BANDAGES/DRESSINGS) IMPLANT
ELECT REM PT RETURN 9FT ADLT (ELECTROSURGICAL)
ELECTRODE REM PT RTRN 9FT ADLT (ELECTROSURGICAL) IMPLANT
FILTER ARTHROSCOPY CONVERTOR (FILTER) ×3 IMPLANT
GLOVE BIO SURGEON STRL SZ 6.5 (GLOVE) ×2 IMPLANT
GLOVE BIO SURGEONS STRL SZ 6.5 (GLOVE) ×1
GLOVE BIOGEL PI IND STRL 7.0 (GLOVE) ×2 IMPLANT
GLOVE BIOGEL PI INDICATOR 7.0 (GLOVE) ×4
GOWN STRL REUS W/TWL LRG LVL3 (GOWN DISPOSABLE) ×6 IMPLANT
IV NS IRRIG 3000ML ARTHROMATIC (IV SOLUTION) ×3 IMPLANT
KIT RM TURNOVER CYSTO AR (KITS) ×3 IMPLANT
LEGGING LITHOTOMY PAIR STRL (DRAPES) ×3 IMPLANT
MYOSURE XL FIBROID REM (MISCELLANEOUS)
NEEDLE SPNL 18GX3.5 QUINCKE PK (NEEDLE) IMPLANT
PACK BASIN DAY SURGERY FS (CUSTOM PROCEDURE TRAY) ×3 IMPLANT
PACK VAGINAL MINOR WOMEN LF (CUSTOM PROCEDURE TRAY) ×3 IMPLANT
PAD OB MATERNITY 4.3X12.25 (PERSONAL CARE ITEMS) ×3 IMPLANT
SEAL ROD LENS SCOPE MYOSURE (ABLATOR) IMPLANT
SYR CONTROL 10ML LL (SYRINGE) IMPLANT
SYSTEM TISS REMOVAL MYSR XL RM (MISCELLANEOUS) IMPLANT
TOWEL OR 17X24 6PK STRL BLUE (TOWEL DISPOSABLE) ×6 IMPLANT
TRAY DSU PREP LF (CUSTOM PROCEDURE TRAY) ×3 IMPLANT
TUBING AQUILEX INFLOW (TUBING) ×3 IMPLANT
TUBING AQUILEX OUTFLOW (TUBING) ×3 IMPLANT
WATER STERILE IRR 500ML POUR (IV SOLUTION) ×3 IMPLANT

## 2016-10-10 NOTE — Transfer of Care (Signed)
Immediate Anesthesia Transfer of Care Note  Patient: Laurie Lewis  Procedure(s) Performed: Procedure(s) (LRB): DILATATION & CURETTAGE/HYSTEROSCOPY WITH NOVASURE ABLATION (N/A)  Patient Location: PACU  Anesthesia Type: General  Level of Consciousness: awake, oriented, sedated and patient cooperative  Airway & Oxygen Therapy: Patient Spontanous Breathing and Patient connected to face mask oxygen  Post-op Assessment: Report given to PACU RN and Post -op Vital signs reviewed and stable  Post vital signs: Reviewed and stable  Complications: No apparent anesthesia complications  Last Vitals:  Vitals:   10/10/16 1051  BP: 98/66  Pulse: 68  Resp: 10  Temp: 36.8 C    Last Pain: There were no vitals filed for this visit.    Patients Stated Pain Goal: 2 (10/10/16 0906)

## 2016-10-10 NOTE — Anesthesia Procedure Notes (Signed)
Procedure Name: LMA Insertion Date/Time: 10/10/2016 10:04 AM Performed by: Denna Haggard D Pre-anesthesia Checklist: Patient identified, Emergency Drugs available, Suction available and Patient being monitored Patient Re-evaluated:Patient Re-evaluated prior to induction Oxygen Delivery Method: Circle system utilized Preoxygenation: Pre-oxygenation with 100% oxygen Induction Type: IV induction Ventilation: Mask ventilation without difficulty LMA: LMA inserted LMA Size: 4.0 Number of attempts: 1 Airway Equipment and Method: Bite block Placement Confirmation: positive ETCO2 Tube secured with: Tape Dental Injury: Teeth and Oropharynx as per pre-operative assessment

## 2016-10-10 NOTE — Anesthesia Preprocedure Evaluation (Signed)
Anesthesia Evaluation  Patient identified by MRN, date of birth, ID band Patient awake    Reviewed: Allergy & Precautions, NPO status , Patient's Chart, lab work & pertinent test results  Airway Mallampati: II  TM Distance: >3 FB Neck ROM: Full    Dental no notable dental hx.    Pulmonary neg pulmonary ROS, Current Smoker,    Pulmonary exam normal breath sounds clear to auscultation       Cardiovascular negative cardio ROS Normal cardiovascular exam Rhythm:Regular Rate:Normal     Neuro/Psych negative neurological ROS  negative psych ROS   GI/Hepatic negative GI ROS, Neg liver ROS,   Endo/Other  negative endocrine ROS  Renal/GU negative Renal ROS  negative genitourinary   Musculoskeletal negative musculoskeletal ROS (+)   Abdominal   Peds negative pediatric ROS (+)  Hematology negative hematology ROS (+)   Anesthesia Other Findings   Reproductive/Obstetrics negative OB ROS                            Anesthesia Physical Anesthesia Plan  ASA: II  Anesthesia Plan: General   Post-op Pain Management:    Induction: Intravenous  PONV Risk Score and Plan: 3 and Ondansetron, Dexamethasone, Propofol and Midazolam  Airway Management Planned: LMA  Additional Equipment:   Intra-op Plan:   Post-operative Plan: Extubation in OR  Informed Consent: I have reviewed the patients History and Physical, chart, labs and discussed the procedure including the risks, benefits and alternatives for the proposed anesthesia with the patient or authorized representative who has indicated his/her understanding and acceptance.   Dental advisory given  Plan Discussed with: CRNA  Anesthesia Plan Comments:         Anesthesia Quick Evaluation

## 2016-10-10 NOTE — Op Note (Signed)
Operative Note    Preoperative Diagnosis: Menorrhagia, dysmenorrhea, endometriosis and fibroids   Postoperative Diagnosis: same   Procedure: Hysteroscopy with dilationa nd curettage and novasure ablation   Surgeon: Terri Piedra, C DO  Anesthesia: general  Fluids: LR 668ml EBL: 53ml UOP: scant   Findings: anterverted uterus measuring 8cm; uterine width 4.3cm and length 4cm; normal ostia b/l   Specimen: endometrial currettings to pathology   Procedure Note Patient was taken to the operating room where  anesthesia was obtained without difficulty. She was then prepped and draped in the normal sterile fashion and placed in the dorsal lithotomy position. An appropriate time out was performed. A speculum was then placed within the vagina and the anterior lip of the cervix identified and injected with approximately 2 cc of 1% plain lidocaine. An additional 9 cc each was placed at 2 and 10:00 for a paracervical block. Uterus was then sounded to 8 cm. The Pratt dilators utilized to dilate the cervix up to approximately 25. The hysterosscope was introduced into the cavity and the findings noted as previously stated; follicular phase endometrium noted. The cervical length measured at 4 cm. The hysteroscope was then removed and a sharp currettage performed till a gritty textures noted in all 4 quadrants. Next, the Novasure device was inserted to the top of the fundus and deployed.  A cavity width of 4.3 noted.  The device was activated with a treatment time of 95 secs. The hysteroscope was then replaced and the cavity noted to have a good treatment effect with blanching and no viable endometrium apparent.  All instruments were removed from the vagina.  The tenaculum site was hemostatic.  Finally the speculum was removed from the vagina and the patient awakened and taken to the recovery room in good condition.  Sponge, lap and needle counts correct x 2 per nursing staff

## 2016-10-10 NOTE — Anesthesia Postprocedure Evaluation (Signed)
Anesthesia Post Note  Patient: Laurie Lewis  Procedure(s) Performed: Procedure(s) (LRB): DILATATION & CURETTAGE/HYSTEROSCOPY WITH NOVASURE ABLATION (N/A)     Patient location during evaluation: PACU Anesthesia Type: General Level of consciousness: awake and alert Pain management: pain level controlled Vital Signs Assessment: post-procedure vital signs reviewed and stable Respiratory status: spontaneous breathing, nonlabored ventilation, respiratory function stable and patient connected to nasal cannula oxygen Cardiovascular status: blood pressure returned to baseline and stable Postop Assessment: no signs of nausea or vomiting Anesthetic complications: no    Last Vitals:  Vitals:   10/10/16 1145 10/10/16 1200  BP: 108/72 106/68  Pulse: (!) 47 62  Resp: 11 12  Temp:      Last Pain:  Vitals:   10/10/16 1145  PainSc: 2                  Montez Hageman

## 2016-10-10 NOTE — Discharge Instructions (Signed)
Nothing in vagina till post op visit   Post Anesthesia Home Care Instructions  Activity: Get plenty of rest for the remainder of the day. A responsible individual must stay with you for 24 hours following the procedure.  For the next 24 hours, DO NOT: -Drive a car -Paediatric nurse -Drink alcoholic beverages -Take any medication unless instructed by your physician -Make any legal decisions or sign important papers.  Meals: Start with liquid foods such as gelatin or soup. Progress to regular foods as tolerated. Avoid greasy, spicy, heavy foods. If nausea and/or vomiting occur, drink only clear liquids until the nausea and/or vomiting subsides. Call your physician if vomiting continues.  Special Instructions/Symptoms: Your throat may feel dry or sore from the anesthesia or the breathing tube placed in your throat during surgery. If this causes discomfort, gargle with warm salt water. The discomfort should disappear within 24 hours.  If you had a scopolamine patch placed behind your ear for the management of post- operative nausea and/or vomiting:  1. The medication in the patch is effective for 72 hours, after which it should be removed.  Wrap patch in a tissue and discard in the trash. Wash hands thoroughly with soap and water. 2. You may remove the patch earlier than 72 hours if you experience unpleasant side effects which may include dry mouth, dizziness or visual disturbances. 3. Avoid touching the patch. Wash your hands with soap and water after contact with the patch.

## 2016-10-10 NOTE — Interval H&P Note (Signed)
History and Physical Interval Note:  10/10/2016 8:56 AM  Laurie Lewis Laurie Lewis  has presented today for surgery, with the diagnosis of dysmenorrhea, menorrhagia, uterine leiomyoma  The various methods of treatment have been discussed with the patient and family. After consideration of risks, benefits and other options for treatment, the patient has consented to  Procedure(s) with comments: [place] (N/A) [place] (N/A) - possible procedure as a surgical intervention .  The patient's history has been reviewed, patient examined, no change in status, stable for surgery.  I have reviewed the patient's chart and labs.  Questions were answered to the patient's satisfaction.   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

## 2016-10-11 ENCOUNTER — Encounter (HOSPITAL_BASED_OUTPATIENT_CLINIC_OR_DEPARTMENT_OTHER): Payer: Self-pay | Admitting: Obstetrics and Gynecology

## 2017-02-01 ENCOUNTER — Encounter: Payer: Self-pay | Admitting: Interventional Radiology

## 2017-07-17 ENCOUNTER — Ambulatory Visit: Payer: BC Managed Care – PPO | Admitting: Emergency Medicine

## 2017-07-17 ENCOUNTER — Encounter: Payer: Self-pay | Admitting: Emergency Medicine

## 2017-07-17 ENCOUNTER — Other Ambulatory Visit: Payer: Self-pay

## 2017-07-17 VITALS — BP 92/58 | HR 94 | Temp 99.3°F | Resp 16 | Ht 67.56 in | Wt 157.6 lb

## 2017-07-17 DIAGNOSIS — G4700 Insomnia, unspecified: Secondary | ICD-10-CM

## 2017-07-17 DIAGNOSIS — N390 Urinary tract infection, site not specified: Secondary | ICD-10-CM | POA: Diagnosis not present

## 2017-07-17 DIAGNOSIS — R300 Dysuria: Secondary | ICD-10-CM

## 2017-07-17 LAB — POCT URINALYSIS DIP (MANUAL ENTRY)
BILIRUBIN UA: NEGATIVE
Glucose, UA: NEGATIVE mg/dL
NITRITE UA: NEGATIVE
PH UA: 6.5 (ref 5.0–8.0)
Protein Ur, POC: 100 mg/dL — AB
Spec Grav, UA: 1.01 (ref 1.010–1.025)
UROBILINOGEN UA: 0.2 U/dL

## 2017-07-17 MED ORDER — ALPRAZOLAM 0.5 MG PO TBDP: 0.5000 mg | ORAL_TABLET | Freq: Every evening | ORAL | 0 refills | Status: AC | PRN

## 2017-07-17 MED ORDER — SULFAMETHOXAZOLE-TRIMETHOPRIM 800-160 MG PO TABS: 1.0000 | ORAL_TABLET | Freq: Two times a day (BID) | ORAL | 0 refills | Status: AC

## 2017-07-17 NOTE — Progress Notes (Signed)
Laurie Lewis 42 y.o.   Chief Complaint  Patient presents with  . Polyuria    Urinary frequency, burning, onset x3 days ago, suspected UTI. Has tried drinking lots of water. Also requesting medication for sleep- states that she has been stressed out and unable to sleep. Onset x3 weeks, getting 5-7 hours a night. Taking 3mg  of melatonin as needed.    HISTORY OF PRESENT ILLNESS: This is a 42 y.o. female complaining of UTI symptoms that started 3 days ago.  Urinary Tract Infection   This is a new problem. The current episode started in the past 7 days. The problem occurs every urination. The problem has been gradually worsening. The quality of the pain is described as burning. The pain is at a severity of 4/10. The pain is moderate. There has been no fever. Associated symptoms include chills, frequency and urgency. Pertinent negatives include no discharge, flank pain, hematuria, nausea or vomiting. There is no history of catheterization, kidney stones, recurrent UTIs or a urological procedure.   Also under tremendous amount of stress.  Unable to sleep.  Requesting some sleeping medication.  Tried OTC medications with little success.  Prior to Admission medications   Medication Sig Start Date End Date Taking? Authorizing Provider  Melatonin 3 MG SUBL Place under the tongue as needed.   Yes [provider]  Misc Natural Products (DEEP SLEEP PO) Take by mouth as needed.   Yes [provider]  VALERIAN PO Take by mouth as needed.   Yes [provider]    Allergies  Allergen Reactions  . Benadryl [Diphenhydramine] Other (See Comments)    "Hypes her up"  . Epinephrine Other (See Comments)    "Hypes her up"--patient doesn't tolerate well    Patient Active Problem List   Diagnosis Date Noted  . Menorrhagia 10/10/2016  . Dysmenorrhea 10/10/2016  . Endometriosis 10/10/2016  . Fibroids 10/10/2016  . Status post hysteroscopic ablation of endometrium 10/10/2016  .  Acute laryngitis 04/26/2016  . Left hip pain 02/28/2016  . PEPTIC ULCER DISEASE 01/31/2010    Past Medical History:  Diagnosis Date  . Anxiety   . Depression   . Dysmenorrhea   . Eczema   . History of bleeding peptic ulcer    age 17--    . History of chlamydia   . History of endometriosis 2008  . History of uterine fibroid   . Infertility, female   . Menorrhagia     Past Surgical History:  Procedure Laterality Date  . DILITATION & CURRETTAGE/HYSTROSCOPY WITH NOVASURE ABLATION N/A 10/10/2016   Procedure: DILATATION & CURETTAGE/HYSTEROSCOPY WITH NOVASURE ABLATION;  Surgeon: Sherlyn Hay, DO;  Location: [facility];  Service: Gynecology;  Laterality: N/A;  . IR RADIOLOGIST EVAL & MGMT  09/19/2016  . WISDOM TOOTH EXTRACTION  age 6    Social History   Socioeconomic History  . Marital status: Unknown    Spouse name: Not on file  . Number of children: Not on file  . Years of education: Not on file  . Highest education level: Not on file  Occupational History  . Not on file  Social Needs  . Financial resource strain: Not on file  . Food insecurity:    Worry: Not on file    Inability: Not on file  . Transportation needs:    Medical: Not on file    Non-medical: Not on file  Tobacco Use  . Smoking status: Former Smoker    Packs/day: 0.50  Years: 20.00    Pack years: 10.00    Types: Cigarettes    Last attempt to quit: 06/28/2009    Years since quitting: 8.0  . Smokeless tobacco: Never Used  Substance and Sexual Activity  . Alcohol use: Yes    Alcohol/week: 3.0 oz    Types: 5 Standard drinks or equivalent per week    Comment: occassional  . Drug use: No  . Sexual activity: Yes    Partners: Female, Female    Birth control/protection: Condom    Comment: current partner female(2016)  Lifestyle  . Physical activity:    Days per week: Not on file    Minutes per session: Not on file  . Stress: Not on file  Relationships  . Social connections:     Talks on phone: Not on file    Gets together: Not on file    Attends religious service: Not on file    Active member of club or organization: Not on file    Attends meetings of clubs or organizations: Not on file    Relationship status: Not on file  . Intimate partner violence:    Fear of current or ex partner: Not on file    Emotionally abused: Not on file    Physically abused: Not on file    Forced sexual activity: Not on file  Other Topics Concern  . Not on file  Social History Narrative  . Not on file    Family History  Problem Relation Age of Onset  . Breast cancer Mother 10  . Diabetes Mother   . Hypertension Mother   . Stroke Mother   . Cancer Father 90       dec--old age at 74  . Diabetes Father   . Stroke Father   . Breast cancer Maternal Grandmother 79       dec  . Diabetes Paternal Grandfather      Review of Systems  Constitutional: Positive for chills. Negative for fever.  HENT: Negative.  Negative for sore throat.   Eyes: Negative.   Respiratory: Negative.  Negative for cough and shortness of breath.   Cardiovascular: Negative.  Negative for chest pain and palpitations.  Gastrointestinal: Negative.  Negative for abdominal pain, blood in stool, diarrhea, nausea and vomiting.  Genitourinary: Positive for frequency and urgency. Negative for flank pain and hematuria.  Skin: Negative.  Negative for rash.  Neurological: Negative.  Negative for dizziness, sensory change, focal weakness and headaches.  Endo/Heme/Allergies: Negative.   Psychiatric/Behavioral: The patient is nervous/anxious and has insomnia.   All other systems reviewed and are negative.   Vitals:   07/17/17 1715  BP: (!) 92/58  Pulse: 94  Resp: 16  Temp: 99.3 F (37.4 C)  SpO2: 98%    Physical Exam  Constitutional: She is oriented to person, place, and time. She appears well-developed and well-nourished.  HENT:  Head: Normocephalic and atraumatic.  Nose: Nose normal.  Mouth/Throat:  Oropharynx is clear and moist.  Eyes: Pupils are equal, round, and reactive to light. EOM are normal.  Neck: Normal range of motion. Neck supple.  Cardiovascular: Normal rate and regular rhythm.  Pulmonary/Chest: Effort normal and breath sounds normal.  Musculoskeletal: Normal range of motion.  Neurological: She is alert and oriented to person, place, and time. No sensory deficit. She exhibits normal muscle tone.  Skin: Skin is warm and dry. Capillary refill takes less than 2 seconds.  Psychiatric: She has a normal mood and affect. Her behavior is  normal.  Vitals reviewed.  Results for orders placed or performed in visit on 07/17/17 (from the past 24 hour(s))  POCT urinalysis dipstick     Status: Abnormal   Collection Time: 07/17/17  5:34 PM  Result Value Ref Range   Color, UA yellow yellow   Clarity, UA cloudy (A) clear   Glucose, UA negative negative mg/dL   Bilirubin, UA negative negative   Ketones, POC UA small (15) (A) negative mg/dL   Spec Grav, UA 1.010 1.010 - 1.025   Blood, UA large (A) negative   pH, UA 6.5 5.0 - 8.0   Protein Ur, POC =100 (A) negative mg/dL   Urobilinogen, UA 0.2 0.2 or 1.0 E.U./dL   Nitrite, UA Negative Negative   Leukocytes, UA Large (3+) (A) Negative     ASSESSMENT & PLAN: Laurie Lewis was seen today for polyuria.  Diagnoses and all orders for this visit:  Acute UTI  Burning with urination -     Cancel: Urinalysis Dipstick -     Urine Culture -     POCT urinalysis dipstick -     sulfamethoxazole-trimethoprim (BACTRIM DS,SEPTRA DS) 800-160 MG tablet; Take 1 tablet by mouth 2 (two) times daily for 7 days.  Insomnia, unspecified type -     ALPRAZolam (NIRAVAM) 0.5 MG dissolvable tablet; Take 1 tablet (0.5 mg total) by mouth at bedtime as needed for anxiety.     Patient Instructions       IF you received an x-ray today, you will receive an invoice from [facility]. Please contact [facility] at [phone] with questions or  concerns regarding your invoice.   IF you received labwork today, you will receive an invoice from [facility]. Please contact [facility] at [phone] with questions or concerns regarding your invoice.   Our billing staff will not be able to assist you with questions regarding bills from these companies.  You will be contacted with the lab results as soon as they are available. The fastest way to get your results is to activate your My Chart account. Instructions are located on the last page of this paperwork. If you have not heard from [place] regarding the results in 2 weeks, please contact this office.     Insomnia Insomnia is a sleep disorder that makes it difficult to fall asleep or to stay asleep. Insomnia can cause tiredness (fatigue), low energy, difficulty concentrating, mood swings, and poor performance at work or school. There are three different ways to classify insomnia:  Difficulty falling asleep.  Difficulty staying asleep.  Waking up too early in the morning.  Any type of insomnia can be long-term (chronic) or short-term (acute). Both are common. Short-term insomnia usually lasts for three months or less. Chronic insomnia occurs at least three times a week for longer than three months. What are the causes? Insomnia may be caused by another condition, situation, or substance, such as:  Anxiety.  Certain medicines.  Gastroesophageal reflux disease (GERD) or other gastrointestinal conditions.  Asthma or other breathing conditions.  Restless legs syndrome, sleep apnea, or other sleep disorders.  Chronic pain.  Menopause. This may include hot flashes.  Stroke.  Abuse of alcohol, tobacco, or illegal drugs.  Depression.  Caffeine.  Neurological disorders, such as Alzheimer disease.  An overactive thyroid (hyperthyroidism).  The cause of insomnia may not be known. What increases the risk? Risk factors for insomnia include:  Gender. Women are more commonly  affected than men.  Age. Insomnia is more common as you get  older.  Stress. This may involve your professional or personal life.  Income. Insomnia is more common in people with lower income.  Lack of exercise.  Irregular work schedule or night shifts.  Traveling between different time zones.  What are the signs or symptoms? If you have insomnia, trouble falling asleep or trouble staying asleep is the main symptom. This may lead to other symptoms, such as:  Feeling fatigued.  Feeling nervous about going to sleep.  Not feeling rested in the morning.  Having trouble concentrating.  Feeling irritable, anxious, or depressed.  How is this treated? Treatment for insomnia depends on the cause. If your insomnia is caused by an underlying condition, treatment will focus on addressing the condition. Treatment may also include:  Medicines to help you sleep.  Counseling or therapy.  Lifestyle adjustments.  Follow these instructions at home:  Take medicines only as directed by your health care provider.  Keep regular sleeping and waking hours. Avoid naps.  Keep a sleep diary to help you and your health care provider figure out what could be causing your insomnia. Include: ? When you sleep. ? When you wake up during the night. ? How well you sleep. ? How rested you feel the next day. ? Any side effects of medicines you are taking. ? What you eat and drink.  Make your bedroom a comfortable place where it is easy to fall asleep: ? Put up shades or special blackout curtains to block light from outside. ? Use a white noise machine to block noise. ? Keep the temperature cool.  Exercise regularly as directed by your health care provider. Avoid exercising right before bedtime.  Use relaxation techniques to manage stress. Ask your health care provider to suggest some techniques that may work well for you. These may include: ? Breathing exercises. ? Routines to release muscle  tension. ? Visualizing peaceful scenes.  Cut back on alcohol, caffeinated beverages, and cigarettes, especially close to bedtime. These can disrupt your sleep.  Do not overeat or eat spicy foods right before bedtime. This can lead to digestive discomfort that can make it hard for you to sleep.  Limit screen use before bedtime. This includes: ? Watching TV. ? Using your smartphone, tablet, and computer.  Stick to a routine. This can help you fall asleep faster. Try to do a quiet activity, brush your teeth, and go to bed at the same time each night.  Get out of bed if you are still awake after 15 minutes of trying to sleep. Keep the lights down, but try reading or doing a quiet activity. When you feel sleepy, go back to bed.  Make sure that you drive carefully. Avoid driving if you feel very sleepy.  Keep all follow-up appointments as directed by your health care provider. This is important. Contact a health care provider if:  You are tired throughout the day or have trouble in your daily routine due to sleepiness.  You continue to have sleep problems or your sleep problems get worse. Get help right away if:  You have serious thoughts about hurting yourself or someone else. This information is not intended to replace advice given to you by your health care provider. Make sure you discuss any questions you have with your health care provider. Document Released: 03/10/2000 Document Revised: 08/13/2015 Document Reviewed: 12/12/2013 Elsevier Interactive Patient Education  2018 [employer].  Urinary Tract Infection, Adult A urinary tract infection (UTI) is an infection of any part of the urinary  tract. The urinary tract includes the:  Kidneys.  Ureters.  Bladder.  Urethra.  These organs make, store, and get rid of pee (urine) in the body. Follow these instructions at home:  Take over-the-counter and prescription medicines only as told by your doctor.  If you were prescribed  an antibiotic medicine, take it as told by your doctor. Do not stop taking the antibiotic even if you start to feel better.  Avoid the following drinks: ? Alcohol. ? Caffeine. ? Tea. ? Carbonated drinks.  Drink enough fluid to keep your pee clear or pale yellow.  Keep all follow-up visits as told by your doctor. This is important.  Make sure to: ? Empty your bladder often and completely. Do not to hold pee for long periods of time. ? Empty your bladder before and after sex. ? Wipe from front to back after a bowel movement if you are female. Use each tissue one time when you wipe. Contact a doctor if:  You have back pain.  You have a fever.  You feel sick to your stomach (nauseous).  You throw up (vomit).  Your symptoms do not get better after 3 days.  Your symptoms go away and then come back. Get help right away if:  You have very bad back pain.  You have very bad lower belly (abdominal) pain.  You are throwing up and cannot keep down any medicines or water. This information is not intended to replace advice given to you by your health care provider. Make sure you discuss any questions you have with your health care provider. Document Released: 08/30/2007 Document Revised: 08/19/2015 Document Reviewed: 02/01/2015 Elsevier Interactive Patient Education  2018 [employer]      Agustina Caroli, MD [facility]

## 2017-07-17 NOTE — Patient Instructions (Addendum)
   IF you received an x-ray today, you will receive an invoice from [facility]. Please contact [facility] at [phone] with questions or concerns regarding your invoice.   IF you received labwork today, you will receive an invoice from [facility]. Please contact [facility] at [phone] with questions or concerns regarding your invoice.   Our billing staff will not be able to assist you with questions regarding bills from these companies.  You will be contacted with the lab results as soon as they are available. The fastest way to get your results is to activate your My Chart account. Instructions are located on the last page of this paperwork. If you have not heard from us regarding the results in 2 weeks, please contact this office.    Insomnia Insomnia is a sleep disorder that makes it difficult to fall asleep or to stay asleep. Insomnia can cause tiredness (fatigue), low energy, difficulty concentrating, mood swings, and poor performance at work or school. There are three different ways to classify insomnia:  Difficulty falling asleep.  Difficulty staying asleep.  Waking up too early in the morning.  Any type of insomnia can be long-term (chronic) or short-term (acute). Both are common. Short-term insomnia usually lasts for three months or less. Chronic insomnia occurs at least three times a week for longer than three months. What are the causes? Insomnia may be caused by another condition, situation, or substance, such as:  Anxiety.  Certain medicines.  Gastroesophageal reflux disease (GERD) or other gastrointestinal conditions.  Asthma or other breathing conditions.  Restless legs syndrome, sleep apnea, or other sleep disorders.  Chronic pain.  Menopause. This may include hot flashes.  Stroke.  Abuse of alcohol, tobacco, or illegal drugs.  Depression.  Caffeine.  Neurological disorders, such as Alzheimer disease.  An overactive thyroid  (hyperthyroidism).  The cause of insomnia may not be known. What increases the risk? Risk factors for insomnia include:  Gender. Women are more commonly affected than men.  Age. Insomnia is more common as you get older.  Stress. This may involve your professional or personal life.  Income. Insomnia is more common in people with lower income.  Lack of exercise.  Irregular work schedule or night shifts.  Traveling between different time zones.  What are the signs or symptoms? If you have insomnia, trouble falling asleep or trouble staying asleep is the main symptom. This may lead to other symptoms, such as:  Feeling fatigued.  Feeling nervous about going to sleep.  Not feeling rested in the morning.  Having trouble concentrating.  Feeling irritable, anxious, or depressed.  How is this treated? Treatment for insomnia depends on the cause. If your insomnia is caused by an underlying condition, treatment will focus on addressing the condition. Treatment may also include:  Medicines to help you sleep.  Counseling or therapy.  Lifestyle adjustments.  Follow these instructions at home:  Take medicines only as directed by your health care provider.  Keep regular sleeping and waking hours. Avoid naps.  Keep a sleep diary to help you and your health care provider figure out what could be causing your insomnia. Include: ? When you sleep. ? When you wake up during the night. ? How well you sleep. ? How rested you feel the next day. ? Any side effects of medicines you are taking. ? What you eat and drink.  Make your bedroom a comfortable place where it is easy to fall asleep: ? Put up shades or special blackout   blackout curtains to block light from outside. ? Use a white noise machine to block noise. ? Keep the temperature cool.  Exercise regularly as directed by your health care provider. Avoid exercising right before bedtime.  Use relaxation techniques to manage stress. Ask  your health care provider to suggest some techniques that may work well for you. These may include: ? Breathing exercises. ? Routines to release muscle tension. ? Visualizing peaceful scenes.  Cut back on alcohol, caffeinated beverages, and cigarettes, especially close to bedtime. These can disrupt your sleep.  Do not overeat or eat spicy foods right before bedtime. This can lead to digestive discomfort that can make it hard for you to sleep.  Limit screen use before bedtime. This includes: ? Watching TV. ? Using your smartphone, tablet, and computer.  Stick to a routine. This can help you fall asleep faster. Try to do a quiet activity, brush your teeth, and go to bed at the same time each night.  Get out of bed if you are still awake after 15 minutes of trying to sleep. Keep the lights down, but try reading or doing a quiet activity. When you feel sleepy, go back to bed.  Make sure that you drive carefully. Avoid driving if you feel very sleepy.  Keep all follow-up appointments as directed by your health care provider. This is important. Contact a health care provider if:  You are tired throughout the day or have trouble in your daily routine due to sleepiness.  You continue to have sleep problems or your sleep problems get worse. Get help right away if:  You have serious thoughts about hurting yourself or someone else. This information is not intended to replace advice given to you by your health care provider. Make sure you discuss any questions you have with your health care provider. Document Released: 03/10/2000 Document Revised: 08/13/2015 Document Reviewed: 12/12/2013 Elsevier Interactive Patient Education  2018 [employer].  Urinary Tract Infection, Adult A urinary tract infection (UTI) is an infection of any part of the urinary tract. The urinary tract includes the:  Kidneys.  Ureters.  Bladder.  Urethra.  These organs make, store, and get rid of pee (urine) in  the body. Follow these instructions at home:  Take over-the-counter and prescription medicines only as told by your doctor.  If you were prescribed an antibiotic medicine, take it as told by your doctor. Do not stop taking the antibiotic even if you start to feel better.  Avoid the following drinks: ? Alcohol. ? Caffeine. ? Tea. ? Carbonated drinks.  Drink enough fluid to keep your pee clear or pale yellow.  Keep all follow-up visits as told by your doctor. This is important.  Make sure to: ? Empty your bladder often and completely. Do not to hold pee for long periods of time. ? Empty your bladder before and after sex. ? Wipe from front to back after a bowel movement if you are female. Use each tissue one time when you wipe. Contact a doctor if:  You have back pain.  You have a fever.  You feel sick to your stomach (nauseous).  You throw up (vomit).  Your symptoms do not get better after 3 days.  Your symptoms go away and then come back. Get help right away if:  You have very bad back pain.  You have very bad lower belly (abdominal) pain.  You are throwing up and cannot keep down any medicines or water. This information is not intended  to replace advice given to you by your health care provider. Make sure you discuss any questions you have with your health care provider. Document Released: 08/30/2007 Document Revised: 08/19/2015 Document Reviewed: 02/01/2015 Elsevier Interactive Patient Education  [employer].

## 2017-07-19 LAB — URINE CULTURE

## 2017-07-20 ENCOUNTER — Encounter: Payer: Self-pay | Admitting: Emergency Medicine

## 2017-07-20 ENCOUNTER — Other Ambulatory Visit: Payer: Self-pay | Admitting: Emergency Medicine

## 2017-07-20 ENCOUNTER — Encounter: Payer: Self-pay | Admitting: *Deleted

## 2017-07-20 MED ORDER — CIPROFLOXACIN HCL 500 MG PO TABS: 500.0000 mg | ORAL_TABLET | Freq: Two times a day (BID) | ORAL | 0 refills | Status: DC

## 2017-07-27 ENCOUNTER — Encounter: Payer: Self-pay | Admitting: Emergency Medicine

## 2017-07-27 ENCOUNTER — Ambulatory Visit: Payer: BC Managed Care – PPO | Admitting: Emergency Medicine

## 2017-07-27 ENCOUNTER — Other Ambulatory Visit: Payer: Self-pay

## 2017-07-27 VITALS — BP 110/58 | HR 62 | Temp 98.1°F | Resp 16 | Wt 155.4 lb

## 2017-07-27 DIAGNOSIS — N390 Urinary tract infection, site not specified: Secondary | ICD-10-CM

## 2017-07-27 LAB — POCT URINALYSIS DIP (MANUAL ENTRY)
Bilirubin, UA: NEGATIVE
GLUCOSE UA: NEGATIVE mg/dL
Ketones, POC UA: NEGATIVE mg/dL
Nitrite, UA: NEGATIVE
Protein Ur, POC: NEGATIVE mg/dL
UROBILINOGEN UA: 0.2 U/dL
pH, UA: 7 (ref 5.0–8.0)

## 2017-07-27 MED ORDER — PHENAZOPYRIDINE HCL 200 MG PO TABS
200.0000 mg | ORAL_TABLET | Freq: Three times a day (TID) | ORAL | 0 refills | Status: DC | PRN
Start: 2017-07-27 — End: 2017-08-03

## 2017-07-27 MED ORDER — CIPROFLOXACIN HCL 500 MG PO TABS: 500.0000 mg | ORAL_TABLET | Freq: Two times a day (BID) | ORAL | 0 refills | Status: DC

## 2017-07-27 NOTE — Progress Notes (Signed)
Laurie Lewis 42 y.o.   Chief Complaint  Patient presents with  . Urinary Tract Infection    continue to have burning at the end of urinating    HISTORY OF PRESENT ILLNESS: This is a 42 y.o. female complaining of UTI symptoms for 2 weeks.  Seen by me on 07/17/2017 for the same.  Urine culture grew E. coli resistant to Bactrim.  Switch to Cipro but patient did not pick up medication at the pharmacy.  Still having some burning at the end of urination.  Denies fever or chills.  Denies nausea or vomiting.  Denies flank pain.  Eating and drinking well.  HPI   Prior to Admission medications   Medication Sig Start Date End Date Taking? Authorizing Provider  ALPRAZolam (NIRAVAM) 0.5 MG dissolvable tablet Take 1 tablet (0.5 mg total) by mouth at bedtime as needed for anxiety. 07/17/17  Yes Candelaria Pies, Ines Bloomer, MD  Misc Natural Products (DEEP SLEEP PO) Take by mouth as needed.   Yes [provider]  VALERIAN PO Take by mouth as needed.   Yes [provider]  ciprofloxacin (CIPRO) 500 MG tablet Take 1 tablet (500 mg total) by mouth 2 (two) times daily for 7 days. 07/27/17 08/03/17  Horald Pollen, MD  Melatonin 3 MG SUBL Place under the tongue as needed.    [provider]  phenazopyridine (PYRIDIUM) 200 MG tablet Take 1 tablet (200 mg total) by mouth 3 (three) times daily as needed for pain. 07/27/17   Horald Pollen, MD    Allergies  Allergen Reactions  . Benadryl [Diphenhydramine] Other (See Comments)    "Hypes her up"  . Epinephrine Other (See Comments)    "Hypes her up"--patient doesn't tolerate well    Patient Active Problem List   Diagnosis Date Noted  . Acute UTI 07/17/2017  . Burning with urination 07/17/2017  . Insomnia 07/17/2017  . Menorrhagia 10/10/2016  . Dysmenorrhea 10/10/2016  . Endometriosis 10/10/2016  . Fibroids 10/10/2016  . Status post hysteroscopic ablation of endometrium 10/10/2016  . Acute laryngitis 04/26/2016  . Left hip  pain 02/28/2016  . PEPTIC ULCER DISEASE 01/31/2010    Past Medical History:  Diagnosis Date  . Anxiety   . Depression   . Dysmenorrhea   . Eczema   . History of bleeding peptic ulcer    age 57--    . History of chlamydia   . History of endometriosis 2008  . History of uterine fibroid   . Infertility, female   . Menorrhagia     Past Surgical History:  Procedure Laterality Date  . DILITATION & CURRETTAGE/HYSTROSCOPY WITH NOVASURE ABLATION N/A 10/10/2016   Procedure: DILATATION & CURETTAGE/HYSTEROSCOPY WITH NOVASURE ABLATION;  Surgeon: Sherlyn Hay, DO;  Location: [facility];  Service: Gynecology;  Laterality: N/A;  . IR RADIOLOGIST EVAL & MGMT  09/19/2016  . WISDOM TOOTH EXTRACTION  age 11    Social History   Socioeconomic History  . Marital status: Unknown    Spouse name: Not on file  . Number of children: Not on file  . Years of education: Not on file  . Highest education level: Not on file  Occupational History  . Not on file  Social Needs  . Financial resource strain: Not on file  . Food insecurity:    Worry: Not on file    Inability: Not on file  . Transportation needs:    Medical: Not on file    Non-medical: Not on file  Tobacco Use  . Smoking status: Former Smoker    Packs/day: 0.50    Years: 20.00    Pack years: 10.00    Types: Cigarettes    Last attempt to quit: 06/28/2009    Years since quitting: 8.0  . Smokeless tobacco: Never Used  Substance and Sexual Activity  . Alcohol use: Yes    Alcohol/week: 3.0 oz    Types: 5 Standard drinks or equivalent per week    Comment: occassional  . Drug use: No  . Sexual activity: Yes    Partners: Female, Female    Birth control/protection: Condom    Comment: current partner female(2016)  Lifestyle  . Physical activity:    Days per week: Not on file    Minutes per session: Not on file  . Stress: Not on file  Relationships  . Social connections:    Talks on phone: Not on file    Gets  together: Not on file    Attends religious service: Not on file    Active member of club or organization: Not on file    Attends meetings of clubs or organizations: Not on file    Relationship status: Not on file  . Intimate partner violence:    Fear of current or ex partner: Not on file    Emotionally abused: Not on file    Physically abused: Not on file    Forced sexual activity: Not on file  Other Topics Concern  . Not on file  Social History Narrative  . Not on file    Family History  Problem Relation Age of Onset  . Breast cancer Mother 46  . Diabetes Mother   . Hypertension Mother   . Stroke Mother   . Cancer Father 67       dec--old age at 1  . Diabetes Father   . Stroke Father   . Breast cancer Maternal Grandmother 48       dec  . Diabetes Paternal Grandfather      Review of Systems  Constitutional: Negative.  Negative for chills and fever.  HENT: Negative.   Eyes: Negative.   Respiratory: Negative for cough and shortness of breath.   Cardiovascular: Negative for chest pain and palpitations.  Gastrointestinal: Negative for abdominal pain, nausea and vomiting.  Genitourinary: Positive for dysuria and frequency. Negative for flank pain and hematuria.  Musculoskeletal: Negative.  Negative for myalgias and neck pain.  Skin: Negative.  Negative for rash.  Neurological: Negative for dizziness and headaches.  All other systems reviewed and are negative.  Vitals:   07/27/17 1725  BP: (!) 110/58  Pulse: 62  Resp: 16  Temp: 98.1 F (36.7 C)  SpO2: 99%     Physical Exam  Constitutional: She is oriented to person, place, and time. She appears well-developed and well-nourished.  HENT:  Head: Normocephalic and atraumatic.  Eyes: Pupils are equal, round, and reactive to light. EOM are normal.  Neck: Normal range of motion. Neck supple.  Cardiovascular: Normal rate.  Pulmonary/Chest: Effort normal. No respiratory distress.  Abdominal: Soft. There is no  tenderness.  Musculoskeletal: Normal range of motion.  Neurological: She is alert and oriented to person, place, and time.  Skin: Skin is warm and dry. Capillary refill takes less than 2 seconds.  Psychiatric: She has a normal mood and affect. Her behavior is normal.  Vitals reviewed.  Results for orders placed or performed in visit on 07/27/17 (from the past 24 hour(s))  POCT urinalysis dipstick  Status: Abnormal   Collection Time: 07/27/17  5:41 PM  Result Value Ref Range   Color, UA yellow yellow   Clarity, UA clear clear   Glucose, UA negative negative mg/dL   Bilirubin, UA negative negative   Ketones, POC UA negative negative mg/dL   Spec Grav, UA <=1.005 (A) 1.010 - 1.025   Blood, UA small (A) negative   pH, UA 7.0 5.0 - 8.0   Protein Ur, POC negative negative mg/dL   Urobilinogen, UA 0.2 0.2 or 1.0 E.U./dL   Nitrite, UA Negative Negative   Leukocytes, UA Large (3+) (A) Negative     ASSESSMENT & PLAN: Jatziry was seen today for urinary tract infection.  Diagnoses and all orders for this visit:  Acute UTI -     Urine Culture -     POCT urinalysis dipstick  Other orders -     ciprofloxacin (CIPRO) 500 MG tablet; Take 1 tablet (500 mg total) by mouth 2 (two) times daily for 7 days. -     phenazopyridine (PYRIDIUM) 200 MG tablet; Take 1 tablet (200 mg total) by mouth 3 (three) times daily as needed for pain.    Patient Instructions       IF you received an x-ray today, you will receive an invoice from [facility]. Please contact [facility] at [phone] with questions or concerns regarding your invoice.   IF you received labwork today, you will receive an invoice from [facility]. Please contact [facility] at [phone] with questions or concerns regarding your invoice.   Our billing staff will not be able to assist you with questions regarding bills from these companies.  You will be contacted with the lab results as soon as they are  available. The fastest way to get your results is to activate your My Chart account. Instructions are located on the last page of this paperwork. If you have not heard from [place] regarding the results in 2 weeks, please contact this office.     Urinary Tract Infection, Adult A urinary tract infection (UTI) is an infection of any part of the urinary tract. The urinary tract includes the:  Kidneys.  Ureters.  Bladder.  Urethra.  These organs make, store, and get rid of pee (urine) in the body. Follow these instructions at home:  Take over-the-counter and prescription medicines only as told by your doctor.  If you were prescribed an antibiotic medicine, take it as told by your doctor. Do not stop taking the antibiotic even if you start to feel better.  Avoid the following drinks: ? Alcohol. ? Caffeine. ? Tea. ? Carbonated drinks.  Drink enough fluid to keep your pee clear or pale yellow.  Keep all follow-up visits as told by your doctor. This is important.  Make sure to: ? Empty your bladder often and completely. Do not to hold pee for long periods of time. ? Empty your bladder before and after sex. ? Wipe from front to back after a bowel movement if you are female. Use each tissue one time when you wipe. Contact a doctor if:  You have back pain.  You have a fever.  You feel sick to your stomach (nauseous).  You throw up (vomit).  Your symptoms do not get better after 3 days.  Your symptoms go away and then come back. Get help right away if:  You have very bad back pain.  You have very bad lower belly (abdominal) pain.  You are throwing up and cannot keep down  any medicines or water. This information is not intended to replace advice given to you by your health care provider. Make sure you discuss any questions you have with your health care provider. Document Released: 08/30/2007 Document Revised: 08/19/2015 Document Reviewed: 02/01/2015 Elsevier Interactive  Patient Education  2018 [employer]      Agustina Caroli, MD [facility]

## 2017-07-27 NOTE — Patient Instructions (Addendum)
     IF you received an x-ray today, you will receive an invoice from [facility]. Please contact [facility] at [phone] with questions or concerns regarding your invoice.   IF you received labwork today, you will receive an invoice from [facility]. Please contact [facility] at [phone] with questions or concerns regarding your invoice.   Our billing staff will not be able to assist you with questions regarding bills from these companies.  You will be contacted with the lab results as soon as they are available. The fastest way to get your results is to activate your My Chart account. Instructions are located on the last page of this paperwork. If you have not heard from us regarding the results in 2 weeks, please contact this office.     Urinary Tract Infection, Adult A urinary tract infection (UTI) is an infection of any part of the urinary tract. The urinary tract includes the:  Kidneys.  Ureters.  Bladder.  Urethra.  These organs make, store, and get rid of pee (urine) in the body. Follow these instructions at home:  Take over-the-counter and prescription medicines only as told by your doctor.  If you were prescribed an antibiotic medicine, take it as told by your doctor. Do not stop taking the antibiotic even if you start to feel better.  Avoid the following drinks: ? Alcohol. ? Caffeine. ? Tea. ? Carbonated drinks.  Drink enough fluid to keep your pee clear or pale yellow.  Keep all follow-up visits as told by your doctor. This is important.  Make sure to: ? Empty your bladder often and completely. Do not to hold pee for long periods of time. ? Empty your bladder before and after sex. ? Wipe from front to back after a bowel movement if you are female. Use each tissue one time when you wipe. Contact a doctor if:  You have back pain.  You have a fever.  You feel sick to your stomach (nauseous).  You throw up (vomit).  Your symptoms do  not get better after 3 days.  Your symptoms go away and then come back. Get help right away if:  You have very bad back pain.  You have very bad lower belly (abdominal) pain.  You are throwing up and cannot keep down any medicines or water. This information is not intended to replace advice given to you by your health care provider. Make sure you discuss any questions you have with your health care provider. Document Released: 08/30/2007 Document Revised: 08/19/2015 Document Reviewed: 02/01/2015 Elsevier Interactive Patient Education  2018 [employer]  

## 2017-07-29 LAB — URINE CULTURE

## 2017-07-30 ENCOUNTER — Encounter: Payer: Self-pay | Admitting: Emergency Medicine

## 2017-08-03 ENCOUNTER — Other Ambulatory Visit: Payer: Self-pay

## 2017-08-03 ENCOUNTER — Encounter: Payer: Self-pay | Admitting: Emergency Medicine

## 2017-08-03 ENCOUNTER — Ambulatory Visit: Payer: BC Managed Care – PPO | Admitting: Emergency Medicine

## 2017-08-03 VITALS — BP 100/62 | HR 71 | Temp 98.5°F | Resp 16 | Ht 67.5 in | Wt 155.4 lb

## 2017-08-03 DIAGNOSIS — Z0000 Encounter for general adult medical examination without abnormal findings: Secondary | ICD-10-CM | POA: Diagnosis not present

## 2017-08-03 NOTE — Progress Notes (Signed)
Laurie Lewis 42 y.o.   Chief Complaint  Patient presents with  . New pt    not to the practice, here to estab care, pt doesn't have any concerns today    HISTORY OF PRESENT ILLNESS: This is a 42 y.o. female Here for annual exam; no complaints and no medical concerns.  Saw gynecologist yesterday for routine exam.  Everything was okay.  Up-to-date with Pap smear. Smoking: No Drinking: social Sleeping: Adequate Work: Regular hours; Theatre manager Exercise: Adequate Stress: Moderate Nutrition: Good    HPI   Prior to Admission medications   Medication Sig Start Date End Date Taking? Authorizing Provider  Melatonin 3 MG SUBL Place under the tongue as needed.   Yes [provider]  Misc Natural Products (DEEP SLEEP PO) Take by mouth as needed.   Yes [provider]  VALERIAN PO Take by mouth as needed.   Yes [provider]  ALPRAZolam (NIRAVAM) 0.5 MG dissolvable tablet Take 1 tablet (0.5 mg total) by mouth at bedtime as needed for anxiety. Patient not taking: Reported on 08/03/2017 07/17/17   Horald Pollen, MD    Allergies  Allergen Reactions  . Benadryl [Diphenhydramine] Other (See Comments)    "Hypes her up"  . Epinephrine Other (See Comments)    "Hypes her up"--patient doesn't tolerate well    Patient Active Problem List   Diagnosis Date Noted  . Acute UTI 07/17/2017  . Burning with urination 07/17/2017  . Insomnia 07/17/2017  . Menorrhagia 10/10/2016  . Dysmenorrhea 10/10/2016  . Endometriosis 10/10/2016  . Fibroids 10/10/2016  . Status post hysteroscopic ablation of endometrium 10/10/2016  . Acute laryngitis 04/26/2016  . Left hip pain 02/28/2016  . PEPTIC ULCER DISEASE 01/31/2010    Past Medical History:  Diagnosis Date  . Anxiety   . Depression   . Dysmenorrhea   . Eczema   . History of bleeding peptic ulcer    age 68--    . History of chlamydia   . History of endometriosis 2008  . History of uterine fibroid   .  Infertility, female   . Menorrhagia     Past Surgical History:  Procedure Laterality Date  . DILITATION & CURRETTAGE/HYSTROSCOPY WITH NOVASURE ABLATION N/A 10/10/2016   Procedure: DILATATION & CURETTAGE/HYSTEROSCOPY WITH NOVASURE ABLATION;  Surgeon: Sherlyn Hay, DO;  Location: [facility];  Service: Gynecology;  Laterality: N/A;  . IR RADIOLOGIST EVAL & MGMT  09/19/2016  . WISDOM TOOTH EXTRACTION  age 50    Social History   Socioeconomic History  . Marital status: Unknown    Spouse name: Not on file  . Number of children: Not on file  . Years of education: Not on file  . Highest education level: Not on file  Occupational History  . Not on file  Social Needs  . Financial resource strain: Not on file  . Food insecurity:    Worry: Not on file    Inability: Not on file  . Transportation needs:    Medical: Not on file    Non-medical: Not on file  Tobacco Use  . Smoking status: Former Smoker    Packs/day: 0.50    Years: 20.00    Pack years: 10.00    Types: Cigarettes    Last attempt to quit: 06/28/2009    Years since quitting: 8.1  . Smokeless tobacco: Never Used  Substance and Sexual Activity  . Alcohol use: Yes    Alcohol/week: 3.0 oz  Types: 5 Standard drinks or equivalent per week    Comment: occassional  . Drug use: No  . Sexual activity: Yes    Partners: Female, Female    Birth control/protection: Condom    Comment: current partner female(2016)  Lifestyle  . Physical activity:    Days per week: Not on file    Minutes per session: Not on file  . Stress: Not on file  Relationships  . Social connections:    Talks on phone: Not on file    Gets together: Not on file    Attends religious service: Not on file    Active member of club or organization: Not on file    Attends meetings of clubs or organizations: Not on file    Relationship status: Not on file  . Intimate partner violence:    Fear of current or ex partner: Not on file     Emotionally abused: Not on file    Physically abused: Not on file    Forced sexual activity: Not on file  Other Topics Concern  . Not on file  Social History Narrative  . Not on file    Family History  Problem Relation Age of Onset  . Breast cancer Mother 71  . Diabetes Mother   . Hypertension Mother   . Stroke Mother   . Cancer Father 62       dec--old age at 48  . Diabetes Father   . Stroke Father   . Breast cancer Maternal Grandmother 3       dec  . Diabetes Paternal Grandfather      Review of Systems  Constitutional: Negative.  Negative for chills and fever.  HENT: Negative.  Negative for congestion, hearing loss, nosebleeds and sore throat.   Eyes: Negative.  Negative for discharge and redness.  Respiratory: Negative.  Negative for cough and shortness of breath.   Cardiovascular: Negative.  Negative for chest pain and palpitations.  Gastrointestinal: Negative.  Negative for abdominal pain, blood in stool, diarrhea, nausea and vomiting.  Genitourinary: Negative.  Negative for dysuria and hematuria.  Musculoskeletal: Negative.  Negative for back pain, myalgias and neck pain.  Skin: Negative.  Negative for rash.  Neurological: Negative.  Negative for dizziness and headaches.  Endo/Heme/Allergies: Negative.   All other systems reviewed and are negative.   Vitals:   08/03/17 1022  BP: 100/62  Pulse: 71  Resp: 16  Temp: 98.5 F (36.9 C)  SpO2: 97%    Physical Exam  Constitutional: She is oriented to person, place, and time. She appears well-developed and well-nourished.  HENT:  Head: Normocephalic and atraumatic.  Right Ear: External ear normal.  Left Ear: External ear normal.  Nose: Nose normal.  Mouth/Throat: Oropharynx is clear and moist.  Eyes: Pupils are equal, round, and reactive to light. Conjunctivae and EOM are normal.  Neck: Normal range of motion. Neck supple. No JVD present. No thyromegaly present.  Cardiovascular: Normal rate, regular rhythm  and normal heart sounds.  Pulmonary/Chest: Effort normal and breath sounds normal.  Abdominal: Soft. Bowel sounds are normal. She exhibits no distension. There is no tenderness.  Musculoskeletal: Normal range of motion.  Lymphadenopathy:    She has no cervical adenopathy.  Neurological: She is alert and oriented to person, place, and time. No sensory deficit. She exhibits normal muscle tone.  Skin: Skin is warm and dry. Capillary refill takes less than 2 seconds. No rash noted.  Psychiatric: She has a normal mood and affect. Her  behavior is normal.  Vitals reviewed.    ASSESSMENT & PLAN: [facility] was seen today for new pt.  Diagnoses and all orders for this visit:  Routine general medical examination at a health care facility -     CBC with Differential -     Comprehensive metabolic panel -     Hemoglobin A1c -     Lipid panel -     HIV antibody -     Urine Culture   Patient Instructions       IF you received an x-ray today, you will receive an invoice from [facility]. Please contact [facility] at [phone] with questions or concerns regarding your invoice.   IF you received labwork today, you will receive an invoice from [facility]. Please contact [facility] at [phone] with questions or concerns regarding your invoice.   Our billing staff will not be able to assist you with questions regarding bills from these companies.  You will be contacted with the lab results as soon as they are available. The fastest way to get your results is to activate your My Chart account. Instructions are located on the last page of this paperwork. If you have not heard from [place] regarding the results in 2 weeks, please contact this office.     Health Maintenance, Female Adopting a healthy lifestyle and getting preventive care can go a long way to promote health and wellness. Talk with your health care provider about what schedule of regular examinations is right for you.  This is a good chance for you to check in with your provider about disease prevention and staying healthy. In between checkups, there are plenty of things you can do on your own. Experts have done a lot of research about which lifestyle changes and preventive measures are most likely to keep you healthy. Ask your health care provider for more information. Weight and diet Eat a healthy diet  Be sure to include plenty of vegetables, fruits, low-fat dairy products, and lean protein.  Do not eat a lot of foods high in solid fats, added sugars, or salt.  Get regular exercise. This is one of the most important things you can do for your health. ? Most adults should exercise for at least 150 minutes each week. The exercise should increase your heart rate and make you sweat (moderate-intensity exercise). ? Most adults should also do strengthening exercises at least twice a week. This is in addition to the moderate-intensity exercise.  Maintain a healthy weight  Body mass index (BMI) is a measurement that can be used to identify possible weight problems. It estimates body fat based on height and weight. Your health care provider can help determine your BMI and help you achieve or maintain a healthy weight.  For females 83 years of age and older: ? A BMI below 18.5 is considered underweight. ? A BMI of 18.5 to 24.9 is normal. ? A BMI of 25 to 29.9 is considered overweight. ? A BMI of 30 and above is considered obese.  Watch levels of cholesterol and blood lipids  You should start having your blood tested for lipids and cholesterol at 43 years of age, then have this test every 5 years.  You may need to have your cholesterol levels checked more often if: ? Your lipid or cholesterol levels are high. ? You are older than 43 years of age. ? You are at high risk for heart disease.  Cancer screening Lung Cancer  Lung cancer screening  is recommended for adults 36-81 years old who are at high risk  for lung cancer because of a history of smoking.  A yearly low-dose CT scan of the lungs is recommended for people who: ? Currently smoke. ? Have quit within the past 15 years. ? Have at least a 30-pack-year history of smoking. A pack year is smoking an average of one pack of cigarettes a day for 1 year.  Yearly screening should continue until it has been 15 years since you quit.  Yearly screening should stop if you develop a health problem that would prevent you from having lung cancer treatment.  Breast Cancer  Practice breast self-awareness. This means understanding how your breasts normally appear and feel.  It also means doing regular breast self-exams. Let your health care provider know about any changes, no matter how small.  If you are in your 20s or 30s, you should have a clinical breast exam (CBE) by a health care provider every 1-3 years as part of a regular health exam.  If you are 24 or older, have a CBE every year. Also consider having a breast X-ray (mammogram) every year.  If you have a family history of breast cancer, talk to your health care provider about genetic screening.  If you are at high risk for breast cancer, talk to your health care provider about having an MRI and a mammogram every year.  Breast cancer gene (BRCA) assessment is recommended for women who have family members with BRCA-related cancers. BRCA-related cancers include: ? Breast. ? Ovarian. ? Tubal. ? Peritoneal cancers.  Results of the assessment will determine the need for genetic counseling and BRCA1 and BRCA2 testing.  Cervical Cancer Your health care provider may recommend that you be screened regularly for cancer of the pelvic organs (ovaries, uterus, and vagina). This screening involves a pelvic examination, including checking for microscopic changes to the surface of your cervix (Pap test). You may be encouraged to have this screening done every 3 years, beginning at age 12.  For women  ages 83-65, health care providers may recommend pelvic exams and Pap testing every 3 years, or they may recommend the Pap and pelvic exam, combined with testing for human papilloma virus (HPV), every 5 years. Some types of HPV increase your risk of cervical cancer. Testing for HPV may also be done on women of any age with unclear Pap test results.  Other health care providers may not recommend any screening for nonpregnant women who are considered low risk for pelvic cancer and who do not have symptoms. Ask your health care provider if a screening pelvic exam is right for you.  If you have had past treatment for cervical cancer or a condition that could lead to cancer, you need Pap tests and screening for cancer for at least 20 years after your treatment. If Pap tests have been discontinued, your risk factors (such as having a new sexual partner) need to be reassessed to determine if screening should resume. Some women have medical problems that increase the chance of getting cervical cancer. In these cases, your health care provider may recommend more frequent screening and Pap tests.  Colorectal Cancer  This type of cancer can be detected and often prevented.  Routine colorectal cancer screening usually begins at 43 years of age and continues through 43 years of age.  Your health care provider may recommend screening at an earlier age if you have risk factors for colon cancer.  Your health care provider  may also recommend using home test kits to check for hidden blood in the stool.  A small camera at the end of a tube can be used to examine your colon directly (sigmoidoscopy or colonoscopy). This is done to check for the earliest forms of colorectal cancer.  Routine screening usually begins at age 72.  Direct examination of the colon should be repeated every 5-10 years through 43 years of age. However, you may need to be screened more often if early forms of precancerous polyps or small growths  are found.  Skin Cancer  Check your skin from head to toe regularly.  Tell your health care provider about any new moles or changes in moles, especially if there is a change in a mole's shape or color.  Also tell your health care provider if you have a mole that is larger than the size of a pencil eraser.  Always use sunscreen. Apply sunscreen liberally and repeatedly throughout the day.  Protect yourself by wearing long sleeves, pants, a wide-brimmed hat, and sunglasses whenever you are outside.  Heart disease, diabetes, and high blood pressure  High blood pressure causes heart disease and increases the risk of stroke. High blood pressure is more likely to develop in: ? People who have blood pressure in the high end of the normal range (130-139/85-89 mm Hg). ? People who are overweight or obese. ? People who are African American.  If you are 30-70 years of age, have your blood pressure checked every 3-5 years. If you are 28 years of age or older, have your blood pressure checked every year. You should have your blood pressure measured twice-once when you are at a hospital or clinic, and once when you are not at a hospital or clinic. Record the average of the two measurements. To check your blood pressure when you are not at a hospital or clinic, you can use: ? An automated blood pressure machine at a pharmacy. ? A home blood pressure monitor.  If you are between 78 years and 39 years old, ask your health care provider if you should take aspirin to prevent strokes.  Have regular diabetes screenings. This involves taking a blood sample to check your fasting blood sugar level. ? If you are at a normal weight and have a low risk for diabetes, have this test once every three years after 43 years of age. ? If you are overweight and have a high risk for diabetes, consider being tested at a younger age or more often. Preventing infection Hepatitis B  If you have a higher risk for hepatitis  B, you should be screened for this virus. You are considered at high risk for hepatitis B if: ? You were born in a country where hepatitis B is common. Ask your health care provider which countries are considered high risk. ? Your parents were born in a high-risk country, and you have not been immunized against hepatitis B (hepatitis B vaccine). ? You have HIV or AIDS. ? You use needles to inject street drugs. ? You live with someone who has hepatitis B. ? You have had sex with someone who has hepatitis B. ? You get hemodialysis treatment. ? You take certain medicines for conditions, including cancer, organ transplantation, and autoimmune conditions.  Hepatitis C  Blood testing is recommended for: ? Everyone born from [birth year] through 1966. ? Anyone with known risk factors for hepatitis C.  Sexually transmitted infections (STIs)  You should be screened for sexually transmitted infections (  STIs) including gonorrhea and chlamydia if: ? You are sexually active and are younger than 43 years of age. ? You are older than 43 years of age and your health care provider tells you that you are at risk for this type of infection. ? Your sexual activity has changed since you were last screened and you are at an increased risk for chlamydia or gonorrhea. Ask your health care provider if you are at risk.  If you do not have HIV, but are at risk, it may be recommended that you take a prescription medicine daily to prevent HIV infection. This is called pre-exposure prophylaxis (PrEP). You are considered at risk if: ? You are sexually active and do not regularly use condoms or know the HIV status of your partner(s). ? You take drugs by injection. ? You are sexually active with a partner who has HIV.  Talk with your health care provider about whether you are at high risk of being infected with HIV. If you choose to begin PrEP, you should first be tested for HIV. You should then be tested every 3 months for as  long as you are taking PrEP. Pregnancy  If you are premenopausal and you may become pregnant, ask your health care provider about preconception counseling.  If you may become pregnant, take 400 to 800 micrograms (mcg) of folic acid every day.  If you want to prevent pregnancy, talk to your health care provider about birth control (contraception). Osteoporosis and menopause  Osteoporosis is a disease in which the bones lose minerals and strength with aging. This can result in serious bone fractures. Your risk for osteoporosis can be identified using a bone density scan.  If you are 43 years of age or older, or if you are at risk for osteoporosis and fractures, ask your health care provider if you should be screened.  Ask your health care provider whether you should take a calcium or vitamin D supplement to lower your risk for osteoporosis.  Menopause may have certain physical symptoms and risks.  Hormone replacement therapy may reduce some of these symptoms and risks. Talk to your health care provider about whether hormone replacement therapy is right for you. Follow these instructions at home:  Schedule regular health, dental, and eye exams.  Stay current with your immunizations.  Do not use any tobacco products including cigarettes, chewing tobacco, or electronic cigarettes.  If you are pregnant, do not drink alcohol.  If you are breastfeeding, limit how much and how often you drink alcohol.  Limit alcohol intake to no more than 1 drink per day for nonpregnant women. One drink equals 12 ounces of beer, 5 ounces of wine, or 1 ounces of hard liquor.  Do not use street drugs.  Do not share needles.  Ask your health care provider for help if you need support or information about quitting drugs.  Tell your health care provider if you often feel depressed.  Tell your health care provider if you have ever been abused or do not feel safe at home. This information is not intended  to replace advice given to you by your health care provider. Make sure you discuss any questions you have with your health care provider. Document Released: 09/26/2010 Document Revised: 08/19/2015 Document Reviewed: 12/15/2014 [employer]vier Interactive Patient Education  2018 [employer]      Agustina Caroli, MD [facility]

## 2017-08-03 NOTE — Patient Instructions (Addendum)
   IF you received an x-ray today, you will receive an invoice from [facility]. Please contact [facility] at [phone] with questions or concerns regarding your invoice.   IF you received labwork today, you will receive an invoice from [facility]. Please contact [facility] at [phone] with questions or concerns regarding your invoice.   Our billing staff will not be able to assist you with questions regarding bills from these companies.  You will be contacted with the lab results as soon as they are available. The fastest way to get your results is to activate your My Chart account. Instructions are located on the last page of this paperwork. If you have not heard from us regarding the results in 2 weeks, please contact this office.     Health Maintenance, Female Adopting a healthy lifestyle and getting preventive care can go a long way to promote health and wellness. Talk with your health care provider about what schedule of regular examinations is right for you. This is a good chance for you to check in with your provider about disease prevention and staying healthy. In between checkups, there are plenty of things you can do on your own. Experts have done a lot of research about which lifestyle changes and preventive measures are most likely to keep you healthy. Ask your health care provider for more information. Weight and diet Eat a healthy diet  Be sure to include plenty of vegetables, fruits, low-fat dairy products, and lean protein.  Do not eat a lot of foods high in solid fats, added sugars, or salt.  Get regular exercise. This is one of the most important things you can do for your health. ? Most adults should exercise for at least 150 minutes each week. The exercise should increase your heart rate and make you sweat (moderate-intensity exercise). ? Most adults should also do strengthening exercises at least twice a week. This is in addition to the  moderate-intensity exercise.  Maintain a healthy weight  Body mass index (BMI) is a measurement that can be used to identify possible weight problems. It estimates body fat based on height and weight. Your health care provider can help determine your BMI and help you achieve or maintain a healthy weight.  For females 20 years of age and older: ? A BMI below 18.5 is considered underweight. ? A BMI of 18.5 to 24.9 is normal. ? A BMI of 25 to 29.9 is considered overweight. ? A BMI of 30 and above is considered obese.  Watch levels of cholesterol and blood lipids  You should start having your blood tested for lipids and cholesterol at 43 years of age, then have this test every 5 years.  You may need to have your cholesterol levels checked more often if: ? Your lipid or cholesterol levels are high. ? You are older than 43 years of age. ? You are at high risk for heart disease.  Cancer screening Lung Cancer  Lung cancer screening is recommended for adults 55-80 years old who are at high risk for lung cancer because of a history of smoking.  A yearly low-dose CT scan of the lungs is recommended for people who: ? Currently smoke. ? Have quit within the past 15 years. ? Have at least a 30-pack-year history of smoking. A pack year is smoking an average of one pack of cigarettes a day for 1 year.  Yearly screening should continue until it has been 15 years since you quit.  Yearly   screening should stop if you develop a health problem that would prevent you from having lung cancer treatment.  Breast Cancer  Practice breast self-awareness. This means understanding how your breasts normally appear and feel.  It also means doing regular breast self-exams. Let your health care provider know about any changes, no matter how small.  If you are in your 20s or 30s, you should have a clinical breast exam (CBE) by a health care provider every 1-3 years as part of a regular health exam.  If you  are 73 or older, have a CBE every year. Also consider having a breast X-ray (mammogram) every year.  If you have a family history of breast cancer, talk to your health care provider about genetic screening.  If you are at high risk for breast cancer, talk to your health care provider about having an MRI and a mammogram every year.  Breast cancer gene (BRCA) assessment is recommended for women who have family members with BRCA-related cancers. BRCA-related cancers include: ? Breast. ? Ovarian. ? Tubal. ? Peritoneal cancers.  Results of the assessment will determine the need for genetic counseling and BRCA1 and BRCA2 testing.  Cervical Cancer Your health care provider may recommend that you be screened regularly for cancer of the pelvic organs (ovaries, uterus, and vagina). This screening involves a pelvic examination, including checking for microscopic changes to the surface of your cervix (Pap test). You may be encouraged to have this screening done every 3 years, beginning at age 61.  For women ages 90-65, health care providers may recommend pelvic exams and Pap testing every 3 years, or they may recommend the Pap and pelvic exam, combined with testing for human papilloma virus (HPV), every 5 years. Some types of HPV increase your risk of cervical cancer. Testing for HPV may also be done on women of any age with unclear Pap test results.  Other health care providers may not recommend any screening for nonpregnant women who are considered low risk for pelvic cancer and who do not have symptoms. Ask your health care provider if a screening pelvic exam is right for you.  If you have had past treatment for cervical cancer or a condition that could lead to cancer, you need Pap tests and screening for cancer for at least 20 years after your treatment. If Pap tests have been discontinued, your risk factors (such as having a new sexual partner) need to be reassessed to determine if screening should  resume. Some women have medical problems that increase the chance of getting cervical cancer. In these cases, your health care provider may recommend more frequent screening and Pap tests.  Colorectal Cancer  This type of cancer can be detected and often prevented.  Routine colorectal cancer screening usually begins at 43 years of age and continues through 43 years of age.  Your health care provider may recommend screening at an earlier age if you have risk factors for colon cancer.  Your health care provider may also recommend using home test kits to check for hidden blood in the stool.  A small camera at the end of a tube can be used to examine your colon directly (sigmoidoscopy or colonoscopy). This is done to check for the earliest forms of colorectal cancer.  Routine screening usually begins at age 67.  Direct examination of the colon should be repeated every 5-10 years through 43 years of age. However, you may need to be screened more often if early forms of precancerous polyps  or small growths are found.  Skin Cancer  Check your skin from head to toe regularly.  Tell your health care provider about any new moles or changes in moles, especially if there is a change in a mole's shape or color.  Also tell your health care provider if you have a mole that is larger than the size of a pencil eraser.  Always use sunscreen. Apply sunscreen liberally and repeatedly throughout the day.  Protect yourself by wearing long sleeves, pants, a wide-brimmed hat, and sunglasses whenever you are outside.  Heart disease, diabetes, and high blood pressure  High blood pressure causes heart disease and increases the risk of stroke. High blood pressure is more likely to develop in: ? People who have blood pressure in the high end of the normal range (130-139/85-89 mm Hg). ? People who are overweight or obese. ? People who are African American.  If you are 18-39 years of age, have your blood  pressure checked every 3-5 years. If you are 40 years of age or older, have your blood pressure checked every year. You should have your blood pressure measured twice-once when you are at a hospital or clinic, and once when you are not at a hospital or clinic. Record the average of the two measurements. To check your blood pressure when you are not at a hospital or clinic, you can use: ? An automated blood pressure machine at a pharmacy. ? A home blood pressure monitor.  If you are between 55 years and 79 years old, ask your health care provider if you should take aspirin to prevent strokes.  Have regular diabetes screenings. This involves taking a blood sample to check your fasting blood sugar level. ? If you are at a normal weight and have a low risk for diabetes, have this test once every three years after 43 years of age. ? If you are overweight and have a high risk for diabetes, consider being tested at a younger age or more often. Preventing infection Hepatitis B  If you have a higher risk for hepatitis B, you should be screened for this virus. You are considered at high risk for hepatitis B if: ? You were born in a country where hepatitis B is common. Ask your health care provider which countries are considered high risk. ? Your parents were born in a high-risk country, and you have not been immunized against hepatitis B (hepatitis B vaccine). ? You have HIV or AIDS. ? You use needles to inject street drugs. ? You live with someone who has hepatitis B. ? You have had sex with someone who has hepatitis B. ? You get hemodialysis treatment. ? You take certain medicines for conditions, including cancer, organ transplantation, and autoimmune conditions.  Hepatitis C  Blood testing is recommended for: ? Everyone born from 1945 through 1965. ? Anyone with known risk factors for hepatitis C.  Sexually transmitted infections (STIs)  You should be screened for sexually transmitted  infections (STIs) including gonorrhea and chlamydia if: ? You are sexually active and are younger than 43 years of age. ? You are older than 43 years of age and your health care provider tells you that you are at risk for this type of infection. ? Your sexual activity has changed since you were last screened and you are at an increased risk for chlamydia or gonorrhea. Ask your health care provider if you are at risk.  If you do not have HIV, but are at risk,   it may be recommended that you take a prescription medicine daily to prevent HIV infection. This is called pre-exposure prophylaxis (PrEP). You are considered at risk if: ? You are sexually active and do not regularly use condoms or know the HIV status of your partner(s). ? You take drugs by injection. ? You are sexually active with a partner who has HIV.  Talk with your health care provider about whether you are at high risk of being infected with HIV. If you choose to begin PrEP, you should first be tested for HIV. You should then be tested every 3 months for as long as you are taking PrEP. Pregnancy  If you are premenopausal and you may become pregnant, ask your health care provider about preconception counseling.  If you may become pregnant, take 400 to 800 micrograms (mcg) of folic acid every day.  If you want to prevent pregnancy, talk to your health care provider about birth control (contraception). Osteoporosis and menopause  Osteoporosis is a disease in which the bones lose minerals and strength with aging. This can result in serious bone fractures. Your risk for osteoporosis can be identified using a bone density scan.  If you are 34 years of age or older, or if you are at risk for osteoporosis and fractures, ask your health care provider if you should be screened.  Ask your health care provider whether you should take a calcium or vitamin D supplement to lower your risk for osteoporosis.  Menopause may have certain physical  symptoms and risks.  Hormone replacement therapy may reduce some of these symptoms and risks. Talk to your health care provider about whether hormone replacement therapy is right for you. Follow these instructions at home:  Schedule regular health, dental, and eye exams.  Stay current with your immunizations.  Do not use any tobacco products including cigarettes, chewing tobacco, or electronic cigarettes.  If you are pregnant, do not drink alcohol.  If you are breastfeeding, limit how much and how often you drink alcohol.  Limit alcohol intake to no more than 1 drink per day for nonpregnant women. One drink equals 12 ounces of beer, 5 ounces of wine, or 1 ounces of hard liquor.  Do not use street drugs.  Do not share needles.  Ask your health care provider for help if you need support or information about quitting drugs.  Tell your health care provider if you often feel depressed.  Tell your health care provider if you have ever been abused or do not feel safe at home. This information is not intended to replace advice given to you by your health care provider. Make sure you discuss any questions you have with your health care provider. Document Released: 09/26/2010 Document Revised: 08/19/2015 Document Reviewed: 12/15/2014 Elsevier Interactive Patient Education  [employer].

## 2017-08-04 LAB — CBC WITH DIFFERENTIAL/PLATELET
BASOS ABS: 0 10*3/uL (ref 0.0–0.2)
Basos: 0 %
EOS (ABSOLUTE): 0.1 10*3/uL (ref 0.0–0.4)
Eos: 1 %
Hematocrit: 41.1 % (ref 34.0–46.6)
Hemoglobin: 13.8 g/dL (ref 11.1–15.9)
IMMATURE GRANS (ABS): 0 10*3/uL (ref 0.0–0.1)
IMMATURE GRANULOCYTES: 0 %
LYMPHS ABS: 1.8 10*3/uL (ref 0.7–3.1)
LYMPHS: 27 %
MCH: 31.1 pg (ref 26.6–33.0)
MCHC: 33.6 g/dL (ref 31.5–35.7)
MCV: 93 fL (ref 79–97)
MONOCYTES: 8 %
Monocytes Absolute: 0.5 10*3/uL (ref 0.1–0.9)
NEUTROS PCT: 64 %
Neutrophils Absolute: 4.1 10*3/uL (ref 1.4–7.0)
Platelets: 291 10*3/uL (ref 150–379)
RBC: 4.44 x10E6/uL (ref 3.77–5.28)
RDW: 13.1 % (ref 12.3–15.4)
WBC: 6.5 10*3/uL (ref 3.4–10.8)

## 2017-08-04 LAB — COMPREHENSIVE METABOLIC PANEL
ALK PHOS: 46 IU/L (ref 39–117)
ALT: 14 IU/L (ref 0–32)
AST: 20 IU/L (ref 0–40)
Albumin/Globulin Ratio: 1.6 (ref 1.2–2.2)
Albumin: 4.4 g/dL (ref 3.5–5.5)
BUN / CREAT RATIO: 12 (ref 9–23)
BUN: 10 mg/dL (ref 6–24)
Bilirubin Total: 0.5 mg/dL (ref 0.0–1.2)
CALCIUM: 9.6 mg/dL (ref 8.7–10.2)
CO2: 23 mmol/L (ref 20–29)
CREATININE: 0.82 mg/dL (ref 0.57–1.00)
Chloride: 102 mmol/L (ref 96–106)
GFR calc Af Amer: 102 mL/min/{1.73_m2} (ref >59)
GFR calc non Af Amer: 89 mL/min/{1.73_m2} (ref >59)
GLUCOSE: 93 mg/dL (ref 65–99)
Globulin, Total: 2.7 g/dL (ref 1.5–4.5)
Potassium: 4.3 mmol/L (ref 3.5–5.2)
Sodium: 139 mmol/L (ref 134–144)
Total Protein: 7.1 g/dL (ref 6.0–8.5)

## 2017-08-04 LAB — LIPID PANEL
Chol/HDL Ratio: 2.2 ratio (ref 0.0–4.4)
Cholesterol, Total: 194 mg/dL (ref 100–199)
HDL: 89 mg/dL (ref >39)
LDL Calculated: 94 mg/dL (ref 0–99)
TRIGLYCERIDES: 56 mg/dL (ref 0–149)
VLDL Cholesterol Cal: 11 mg/dL (ref 5–40)

## 2017-08-04 LAB — URINE CULTURE: Organism ID, Bacteria: NO GROWTH

## 2017-08-04 LAB — HEMOGLOBIN A1C
ESTIMATED AVERAGE GLUCOSE: 100 mg/dL
HEMOGLOBIN A1C: 5.1 % (ref 4.8–5.6)

## 2017-08-04 LAB — HIV ANTIBODY (ROUTINE TESTING W REFLEX): HIV Screen 4th Generation wRfx: NONREACTIVE

## 2017-08-07 ENCOUNTER — Encounter: Payer: Self-pay | Admitting: *Deleted

## 2018-02-04 ENCOUNTER — Telehealth: Payer: Self-pay | Admitting: Emergency Medicine

## 2018-02-04 DIAGNOSIS — M5442 Lumbago with sciatica, left side: Secondary | ICD-10-CM

## 2018-02-04 DIAGNOSIS — S32009D Unspecified fracture of unspecified lumbar vertebra, subsequent encounter for fracture with routine healing: Secondary | ICD-10-CM

## 2018-02-04 NOTE — Telephone Encounter (Signed)
Copied from [place] [phone]. Topic: Referral - Request for Referral >> Feb 04, 2018 11:44 AM Yvette Rack wrote: Has patient seen PCP for this complaint? No.  Pt very determined not to come into office wants referral done asap pt states that she is unable to get in and out of car *If NO, is insurance requiring patient see PCP for this issue before PCP can refer them? Referral for which specialty: ortho or neurologist Preferred provider/office: any available first available  Reason for referral: due to fall of FX L2 L3

## 2018-02-04 NOTE — Telephone Encounter (Signed)
Copied from [place] [phone]. Topic: Quick Communication - See Telephone Encounter >> Feb 04, 2018 10:40 AM Charlynn Court wrote: CRM for notification. See Telephone encounter for: 02/04/18. Pt was seen at [place] and was diagnosed with a back sprain. She was told to get a referral from her PCP. Pt is in a lot of pain and prefers not to have to come into the office. She has the notes and the CT report. She stated you can also call [facility] at [phone]. Please call pt back with information on what she needs to do. >> Feb 04, 2018 12:41 PM Carolyn Stare wrote:   Pt made an appt

## 2018-02-04 NOTE — Telephone Encounter (Signed)
Pt calling back stating that [facility] want the office to fax over on a letter head pt name DOB medical date of records that want to be faxed to them which is DOS 02-03-18  The fax# need to be displayed also So that they can release medical records to the PCP   [facility]   [phone]    (F) [phone]   Call pt at [phone] when the records has been sent

## 2018-02-04 NOTE — Telephone Encounter (Signed)
Copied from [place] [phone]. Topic: General - Other >> Feb 04, 2018 10:00 AM Leward Quan A wrote: Reason for CRM: Patient called to say that she had a fall and was seen at a Hospital while out of town. Say that her L2 and L3 are fractured making getting around very hard. She is requesting a referral to a specialist. Patient did not have paperwork with her and due to difficulty getting up she will call back with additional information

## 2018-02-04 NOTE — Telephone Encounter (Signed)
Copied from [place] [phone]. Topic: Quick Communication - See Telephone Encounter >> Feb 04, 2018 10:40 AM Charlynn Court wrote: CRM for notification. See Telephone encounter for: 02/04/18. Pt was seen at [place] and was diagnosed with a back sprain. She was told to get a referral from her PCP. Pt is in a lot of pain and prefers not to have to come into the office. She has the notes and the CT report. She stated you can also call [facility] at [phone]. Please call pt back with information on what she needs to do.

## 2018-02-06 NOTE — Telephone Encounter (Signed)
Please put this patient on Dr. Barry Brunner schedule and I can do one of his physicals or take his 10:20am which is a new patient 42yo Female  I would rather something like this which is going to require referrals, follow up etc be with the primary

## 2018-02-07 ENCOUNTER — Encounter: Payer: Self-pay | Admitting: Family Medicine

## 2018-02-07 ENCOUNTER — Ambulatory Visit: Payer: BC Managed Care – PPO | Admitting: Family Medicine

## 2018-02-07 VITALS — BP 120/77 | HR 75 | Temp 98.2°F | Resp 16 | Ht 67.5 in | Wt 163.8 lb

## 2018-02-07 DIAGNOSIS — M5442 Lumbago with sciatica, left side: Secondary | ICD-10-CM | POA: Diagnosis not present

## 2018-02-07 DIAGNOSIS — S32009D Unspecified fracture of unspecified lumbar vertebra, subsequent encounter for fracture with routine healing: Secondary | ICD-10-CM | POA: Diagnosis not present

## 2018-02-07 DIAGNOSIS — Z780 Asymptomatic menopausal state: Secondary | ICD-10-CM | POA: Diagnosis not present

## 2018-02-07 MED ORDER — OXYCODONE-ACETAMINOPHEN 5-325 MG PO TABS: 1.0000 | ORAL_TABLET | Freq: Four times a day (QID) | ORAL | 0 refills | Status: DC | PRN

## 2018-02-07 MED ORDER — IBUPROFEN 800 MG PO TABS: 800.0000 mg | ORAL_TABLET | Freq: Four times a day (QID) | ORAL | 0 refills | Status: AC | PRN

## 2018-02-07 MED ORDER — KETOROLAC TROMETHAMINE 60 MG/2ML IM SOLN
60.0000 mg | Freq: Once | INTRAMUSCULAR | Status: AC
  Administered 2018-02-07: 60 mg via INTRAMUSCULAR

## 2018-02-07 NOTE — Patient Instructions (Addendum)
If you have lab work done today you will be contacted with your lab results within the next 2 weeks.  If you have not heard from [place] then please contact us. The fastest way to get your results is to register for My Chart.   IF you received an x-ray today, you will receive an invoice from [facility]. Please contact [facility] at [phone] with questions or concerns regarding your invoice.   IF you received labwork today, you will receive an invoice from [facility]. Please contact [facility] at [phone] with questions or concerns regarding your invoice.   Our billing staff will not be able to assist you with questions regarding bills from these companies.  You will be contacted with the lab results as soon as they are available. The fastest way to get your results is to activate your My Chart account. Instructions are located on the last page of this paperwork. If you have not heard from [place] regarding the results in 2 weeks, please contact this office.     Lumbar Fracture A lumbar fracture is a break in one of the bones of the lower back. Lumbar fractures range in severity. Severe fractures can damage the spinal cord. What are the causes? This condition may be caused by:  A fall (common).  A car accident (common).  A gunshot wound.  A hard, direct hit to the back.  Osteoporosis.  What are the signs or symptoms? The main symptom of this condition is severe pain in the lower back. If a fracture is complex or severe, there may also be:  A misshapen or swollen area on the lower back.  A limited ability to move an area of the lower back.  An inability to empty the bladder or bowel.  A loss of strength or sensation in the legs, feet, and toes.  Paralysis.  How is this diagnosed? This condition is diagnosed based on:  A physical exam.  Symptoms and what happened just before they developed.  The results of imaging tests, such as an X-ray, CT scan, or  MRI.  If your nerves have been damaged, you may also have other tests to find out how much damage there is. How is this treated? Treatment for this condition depends on the specifics of the injury. Most fractures can be treated with:  A back brace.  Bed rest and activity restrictions.  Pain medicine.  Physical therapy.  Fractures that are complex, involve multiple bones, or make the spine unstable may require surgery to remove pressure from the nerves or spinal cord and to stabilize the broken pieces of bone. During recovery, it is normal to have pain and stiffness in the back for weeks. Follow these instructions at home: Medicines  Take medicines only as directed by your health care provider.  Do not drive or operate heavy machinery while taking pain medicine. Activity  Stay in bed for as long as directed by your health care provider.  If you were shown how to do any exercises to improve motion and strength in your back, do them as directed by your health care provider.  Return to your normal activities as directed by your health care provider. Ask your health care provider what activities are safe for you. General instructions  If you were given a neck brace or back brace, wear it as directed by your health care provider.  Keep all follow-up visits as directed by your health care provider. This is important. Failure to  follow-up as recommended could result in permanent injury, disability, and long-lasting (chronic) pain. Contact a health care provider if:  Your pain does not improve over time.  You have a persistent cough.  You cannot return to your normal activities as planned or expected. Get help right away if:  You have severe pain or your pain suddenly gets worse.  You are unable to move.  You have numbness, tingling, weakness, or paralysis in any part of your body.  You cannot control your bladder or bowel.  You have difficulty breathing.  You have a  fever.  You have pain in your chest or abdomen.  You vomit. This information is not intended to replace advice given to you by your health care provider. Make sure you discuss any questions you have with your health care provider. Document Released: 06/28/2006 Document Revised: 08/19/2015 Document Reviewed: 03/09/2014 Elsevier Interactive Patient Education  [employer].

## 2018-02-07 NOTE — Telephone Encounter (Signed)
Copied from [place] [phone]. Topic: Quick Communication - Rx Refill/Question >> Feb 07, 2018 12:14 PM [place], [place] D wrote: Medication: oxyCODONE-acetaminophen (PERCOCET/ROXICET) 5-325 MG tablet / Pt stated that the pharmacy could only fill Rx for 1 week at a time so pt picked up one weeks worth. Will need to send in new script for 1 week at a time or have insurance approve Rx for more than 1 week so pt can pick up remaining tablets all at once. Please advise pt. CB# [phone]   Has the patient contacted their pharmacy? Yes.   (Agent: If no, request that the patient contact the pharmacy for the refill.) (Agent: If yes, when and what did the pharmacy advise?)  Preferred Pharmacy (with phone number or street name): [facility] [place], [place] - [address] [phone] (Phone) [phone] (Fax)    Agent: Please be advised that RX refills may take up to 3 business days. We ask that you follow-up with your pharmacy.

## 2018-02-07 NOTE — Telephone Encounter (Signed)
See request. Thanks. 

## 2018-02-07 NOTE — Progress Notes (Signed)
Chief Complaint  Patient presents with  . Back Pain    pain from fall, pain level 2/3 with no movement, with movement nerve pain 7/10 and initial pain level 10/10.    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 Patient reports that she fell and had a direct blow to the low back and states that she was worked up by the ED at [facility] on 02/03/18 She reports that she has appt Monday 02/07/18 for physical therapy.  She took her last pain pill this morning and reports that she is anxious. She reports that the pain stays at a 6 when she is at rest and mounts quickly to a 10 with the slightlest movement.  She cannot find a comfortable position to rest in.  She denies numbness down her legs She reports that the pain is lateral to her spine more on the right than on the left and she just feels pain and back stiffness No fevers or chills No family history of osteoporosis.    She is s/p endometrial ablation She reports that she is postmenopausal according to there [facility] levels    Past Medical History:  Diagnosis Date  . Anxiety   . Depression   . Dysmenorrhea   . Eczema   . History of bleeding peptic ulcer    age 18--    . History of chlamydia   . History of endometriosis 2008  . History of uterine fibroid   . Infertility, female   . Menorrhagia     Current Outpatient Medications  Medication Sig Dispense Refill  . oxyCODONE-acetaminophen (PERCOCET/ROXICET) 5-325 MG tablet Take 1 tablet by mouth every 6 (six) hours as needed for moderate pain or severe pain. 120 tablet 0  . ALPRAZolam (NIRAVAM) 0.5 MG dissolvable tablet Take 1 tablet (0.5 mg total) by mouth at bedtime as needed for anxiety. (Patient not taking: Reported on 08/03/2017) 20 tablet 0  . ibuprofen (ADVIL,MOTRIN) 800 MG tablet Take 1 tablet  (800 mg total) by mouth every 6 (six) hours as needed. 120 tablet 0  . Melatonin 3 MG SUBL Place under the tongue as needed.    . Misc Natural Products (DEEP SLEEP PO) Take by mouth as needed.    Laurie Lewis VALERIAN PO Take by mouth as needed.     Current Facility-Administered Medications  Medication Dose Route Frequency Provider Last Rate Last Dose  . ketorolac (TORADOL) injection 60 mg  60 mg Intramuscular Once Forrest Moron, MD        Allergies:  Allergies  Allergen Reactions  . Benadryl [Diphenhydramine] Other (See Comments)    "Hypes her up"  . Epinephrine Other (See Comments)    "Hypes her up"--patient doesn't tolerate well    Past Surgical History:  Procedure Laterality Date  . DILITATION & CURRETTAGE/HYSTROSCOPY WITH NOVASURE ABLATION N/A 10/10/2016   Procedure: DILATATION & CURETTAGE/HYSTEROSCOPY WITH NOVASURE ABLATION;  Surgeon: Sherlyn Hay, DO;  Location: [place];  Service: Gynecology;  Laterality: N/A;  . IR RADIOLOGIST EVAL & MGMT  09/19/2016  . WISDOM TOOTH EXTRACTION  age 58    Social History   Socioeconomic History  . Marital status: Unknown    Spouse name: Not on file  . Number of children: Not on file  . Years  of education: Not on file  . Highest education level: Not on file  Occupational History  . Not on file  Social Needs  . Financial resource strain: Not on file  . Food insecurity:    Worry: Not on file    Inability: Not on file  . Transportation needs:    Medical: Not on file    Non-medical: Not on file  Tobacco Use  . Smoking status: Former Smoker    Packs/day: 0.50    Years: 20.00    Pack years: 10.00    Types: Cigarettes    Last attempt to quit: 06/28/2009    Years since quitting: 8.6  . Smokeless tobacco: Never Used  Substance and Sexual Activity  . Alcohol use: Yes    Alcohol/week: 5.0 standard drinks    Types: 5 Standard drinks or equivalent per week    Comment: occassional  . Drug use: No  . Sexual activity:  Yes    Partners: Female, Female    Birth control/protection: Condom    Comment: current partner female(2016)  Lifestyle  . Physical activity:    Days per week: Not on file    Minutes per session: Not on file  . Stress: Not on file  Relationships  . Social connections:    Talks on phone: Not on file    Gets together: Not on file    Attends religious service: Not on file    Active member of club or organization: Not on file    Attends meetings of clubs or organizations: Not on file    Relationship status: Not on file  Other Topics Concern  . Not on file  Social History Narrative  . Not on file    Family History  Problem Relation Age of Onset  . Breast cancer Mother 1  . Diabetes Mother   . Hypertension Mother   . Stroke Mother   . Cancer Father 76       dec--old age at 26  . Diabetes Father   . Stroke Father   . Breast cancer Maternal Grandmother 37       dec  . Diabetes Paternal Grandfather      ROS Review of Systems See HPI Constitution: No fevers or chills No malaise No diaphoresis Skin: No rash or itching Eyes: no blurry vision, no double vision GU: no dysuria or hematuria Neuro: no dizziness or headaches  all others reviewed and negative   Objective: Vitals:   02/07/18 1103  BP: 120/77  Pulse: 75  Resp: 16  Temp: 98.2 F (36.8 C)  TempSrc: Oral  SpO2: 96%  Weight: 163 lb 12.8 oz (74.3 kg)  Height: 5' 7.5" (1.715 m)    Physical Exam  Constitutional: She is oriented to person, place, and time. She appears well-developed and well-nourished.  HENT:  Head: Normocephalic and atraumatic.  Eyes: Conjunctivae and EOM are normal.  Neck: Normal range of motion. Neck supple.  Cardiovascular: Normal rate, regular rhythm and normal heart sounds.  Pulmonary/Chest: Effort normal and breath sounds normal. No stridor. No respiratory distress.  Abdominal: Soft. Bowel sounds are normal. She exhibits no distension. There is no tenderness.  Musculoskeletal:        Lumbar back: She exhibits decreased range of motion, tenderness, bony tenderness, pain and spasm. She exhibits no swelling, no edema, no deformity, no laceration and normal pulse.       Back:  Neurological: She is alert and oriented to person, place, and time.  Skin: Skin is  warm. Capillary refill takes less than 2 seconds.  Psychiatric: She has a normal mood and affect. Her behavior is normal. Judgment and thought content normal.   Walks with a cane Tenderness over the lumbar spinous processes L1-sacrum, especially over the right paraspinal muscles    Mildly displaced left L2 and left L3 transverse process fractures.  ATTENDING RADIOLOGIST AGREEMENT [[id number]]: I have personally reviewed the images and agree with the report without modification.  Result Narrative  CLINICAL INFORMATION: 42 year old. Fall. Continued left low back pain and radiating left gluteal pain.  TECHNIQUE: Routine CT of the thoracic and lumbar spine.   COMPARISON: Thoracolumbar radiographs 02/03/2018.   FINDINGS:  12 thoracic and 5 lumbar vertebrae. No spondylolisthesis.   The vertebrae are preserved in height.   Mildly displaced fractures of the left L2 and left L3 transverse processes.  Vertebral body and intervertebral disc heights are normal.   No evidence of significant intraspinal abnormality.   Small focus of sclerosis at T11. Mild multilevel spondylotic changes.  Prevertebral soft tissues are unremarkable.   Other Result Information  [place], Radiant Inresults - 02/03/2018  2:52 PM EST CLINICAL INFORMATION: 42 year old. Fall. Continued left low back pain and radiating left gluteal pain.  TECHNIQUE: Routine CT of the thoracic and lumbar spine.   COMPARISON: Thoracolumbar radiographs 02/03/2018.   FINDINGS:  12 thoracic and 5 lumbar vertebrae. No spondylolisthesis.    The vertebrae are preserved in height.   Mildly displaced fractures of the left L2 and left L3 transverse  processes.  Vertebral body and intervertebral disc heights are normal.    No evidence of significant intraspinal abnormality.    Small focus of sclerosis at T11. Mild multilevel spondylotic changes.  Prevertebral soft tissues are unremarkable.   IMPRESSION:   Mildly displaced left L2 and left L3 transverse process fractures.  ATTENDING RADIOLOGIST AGREEMENT [[id number]]: I have personally reviewed the images and agree with the report without modification.    Assessment and Plan Laurie Lewis was seen today for back pain and bone density testing.  Diagnoses and all orders for this visit:  Closed fracture of transverse process of lumbar vertebra with routine healing, subsequent encounter -     Ambulatory referral to Neurosurgery -     Discontinue: oxyCODONE-acetaminophen (PERCOCET/ROXICET) 5-325 MG tablet; Take 1 tablet by mouth every 6 (six) hours as needed for moderate pain or severe pain. -     ibuprofen (ADVIL,MOTRIN) 800 MG tablet; Take 1 tablet (800 mg total) by mouth every 6 (six) hours as needed. -     ketorolac (TORADOL) injection 60 mg -     oxyCODONE-acetaminophen (PERCOCET/ROXICET) 5-325 MG tablet; Take 1 tablet by mouth every 6 (six) hours as needed for moderate pain or severe pain.  Acute left-sided low back pain with left-sided sciatica -     Ambulatory referral to Neurosurgery -     Discontinue: oxyCODONE-acetaminophen (PERCOCET/ROXICET) 5-325 MG tablet; Take 1 tablet by mouth every 6 (six) hours as needed for moderate pain or severe pain. -     ibuprofen (ADVIL,MOTRIN) 800 MG tablet; Take 1 tablet (800 mg total) by mouth every 6 (six) hours as needed. -     ketorolac (TORADOL) injection 60 mg -     oxyCODONE-acetaminophen (PERCOCET/ROXICET) 5-325 MG tablet; Take 1 tablet by mouth every 6 (six) hours as needed for moderate pain or severe pain.  Postmenopausal  Placed referral to Spine Surgery to review xray films Follow up with PT as instructed Gave tradol in the  office Continue ibuprofen  every 6 hours SCHEDULED and percocet for breakthrough pain     A Nolon Rod

## 2018-02-07 NOTE — Telephone Encounter (Signed)
Patient has had OV since call has been in.

## 2018-02-12 ENCOUNTER — Telehealth: Payer: Self-pay | Admitting: Emergency Medicine

## 2018-02-12 DIAGNOSIS — S32009D Unspecified fracture of unspecified lumbar vertebra, subsequent encounter for fracture with routine healing: Secondary | ICD-10-CM

## 2018-02-12 NOTE — Telephone Encounter (Signed)
Copied from [place] [id number]. Topic: Referral - Request for Referral >> Feb 12, 2018  4:44 PM Antonieta Iba C wrote: Pt is requesting a referral for PT  Location, [place] - Phone: [phone]  Pt has seen provider for her concern pt says that she has a fracture to her L2 and L3    CB: [phone]

## 2018-02-13 NOTE — Telephone Encounter (Signed)
Patient calling to check the status of the 1 weeks worth of medication being sent to the pharmacy. Please advise.  [facility] #[id number] - [place], [address]

## 2018-02-14 NOTE — Telephone Encounter (Signed)
Please send to Dr. Nolon Rod.

## 2018-02-14 NOTE — Telephone Encounter (Signed)
Pt is requesting rx refill,she can only get 1 week at a time. I spoke with Linna Hoff at [facility] [phone], patient can only receive 7 days worth of Oxycodone-Ace 5-325 mg due to insurance. Rx refill request will have to be sent over each time, even though rx was written for #120. Pharmacy suggest prior authorization from insurance.

## 2018-02-15 ENCOUNTER — Encounter: Payer: Self-pay | Admitting: Family Medicine

## 2018-02-15 MED ORDER — OXYCODONE-ACETAMINOPHEN 5-325 MG PO TABS: 1.0000 | ORAL_TABLET | ORAL | 0 refills | Status: AC | PRN

## 2018-02-15 NOTE — Telephone Encounter (Signed)
This patient has a back fracture and needs narcotics.  She has multiple fractures in her lumbar spine.  Please start prior authorization please.

## 2018-02-15 NOTE — Telephone Encounter (Signed)
PA has been started.  Key: [id number]
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
## 2018-02-15 NOTE — Telephone Encounter (Signed)
Patient would like the nurse to call her back.  °

## 2018-02-18 ENCOUNTER — Ambulatory Visit: Payer: BC Managed Care – PPO | Admitting: Family Medicine

## 2018-02-18 ENCOUNTER — Encounter: Payer: Self-pay | Admitting: Family Medicine

## 2018-02-18 VITALS — BP 100/58 | HR 57 | Temp 97.7°F | Ht 67.5 in | Wt 166.9 lb

## 2018-02-18 DIAGNOSIS — S32009A Unspecified fracture of unspecified lumbar vertebra, initial encounter for closed fracture: Secondary | ICD-10-CM | POA: Diagnosis not present

## 2018-02-18 NOTE — Progress Notes (Signed)
Laurie Lewis - 42 y.o. female MRN [id number]  Date of birth: Dec 14, 1975  SUBJECTIVE:  Including CC & ROS.  Chief Complaint  Patient presents with  . Back Pain    Laurie Lewis is a 42 y.o. female that is presenting with back pain.  She was walking down the stairs and fell back and a step hit her in the lower back.  She was evaluated in the emergency department on 11/10 and was found to have a fracture on CT scan.  She has been using a lumbar support brace.  Her pain has improved since this occurred.  She reports she has not had a menstrual cycle in over a year.  Pain is mild and intermittent currently.  She still notices it when she does any flexion or certain movements to the side to side.  She denies any numbness or tingling.  She denies any saddle anesthesia.  Review of the CT scan from 11/10 of the thoracic and lumbar spine shows a mildly displaced fracture of the left L2 and left L3 transverse process.   Review of Systems  Constitutional: Negative for fever.  HENT: Negative for congestion.   Respiratory: Negative for cough.   Cardiovascular: Negative for chest pain.  Gastrointestinal: Negative for abdominal pain.  Musculoskeletal: Positive for back pain.  Skin: Negative for color change.  Neurological: Negative for weakness.  Hematological: Negative for adenopathy.  Psychiatric/Behavioral: Negative for agitation.    HISTORY: Past Medical, Surgical, Social, and Family History Reviewed & Updated per EMR.   Pertinent Historical Findings include:  Past Medical History:  Diagnosis Date  . Anxiety   . Depression   . Dysmenorrhea   . Eczema   . History of bleeding peptic ulcer    age 69--    . History of chlamydia   . History of endometriosis 2008  . History of uterine fibroid   . Infertility, female   . Menorrhagia     Past Surgical History:  Procedure Laterality Date  . DILITATION & CURRETTAGE/HYSTROSCOPY WITH NOVASURE ABLATION N/A 10/10/2016   Procedure: DILATATION &  CURETTAGE/HYSTEROSCOPY WITH NOVASURE ABLATION;  Surgeon: Sherlyn Hay, DO;  Location: [place];  Service: Gynecology;  Laterality: N/A;  . IR RADIOLOGIST EVAL & MGMT  09/19/2016  . WISDOM TOOTH EXTRACTION  age 71    Allergies  Allergen Reactions  . Benadryl [Diphenhydramine] Other (See Comments)    "Hypes her up"  . Epinephrine Other (See Comments)    "Hypes her up"--patient doesn't tolerate well    Family History  Problem Relation Age of Onset  . Breast cancer Mother 4  . Diabetes Mother   . Hypertension Mother   . Stroke Mother   . Cancer Father 70       dec--old age at 55  . Diabetes Father   . Stroke Father   . Breast cancer Maternal Grandmother 84       dec  . Diabetes Paternal Grandfather      Social History   Socioeconomic History  . Marital status: Unknown    Spouse name: Not on file  . Number of children: Not on file  . Years of education: Not on file  . Highest education level: Not on file  Occupational History  . Not on file  Social Needs  . Financial resource strain: Not on file  . Food insecurity:    Worry: Not on file    Inability: Not on file  . Transportation needs:    Medical:  Not on file    Non-medical: Not on file  Tobacco Use  . Smoking status: Former Smoker    Packs/day: 0.50    Years: 20.00    Pack years: 10.00    Types: Cigarettes    Last attempt to quit: 06/28/2009    Years since quitting: 8.6  . Smokeless tobacco: Never Used  Substance and Sexual Activity  . Alcohol use: Yes    Alcohol/week: 5.0 standard drinks    Types: 5 Standard drinks or equivalent per week    Comment: occassional  . Drug use: No  . Sexual activity: Yes    Partners: Female, Female    Birth control/protection: Condom    Comment: current partner female(2016)  Lifestyle  . Physical activity:    Days per week: Not on file    Minutes per session: Not on file  . Stress: Not on file  Relationships  . Social connections:    Talks on  phone: Not on file    Gets together: Not on file    Attends religious service: Not on file    Active member of club or organization: Not on file    Attends meetings of clubs or organizations: Not on file    Relationship status: Not on file  . Intimate partner violence:    Fear of current or ex partner: Not on file    Emotionally abused: Not on file    Physically abused: Not on file    Forced sexual activity: Not on file  Other Topics Concern  . Not on file  Social History Narrative  . Not on file     PHYSICAL EXAM:  VS: BP (!) 100/58 (BP Location: Left Arm, Patient Position: Sitting, Cuff Size: Normal)   Pulse (!) 57   Temp 97.7 F (36.5 C) (Oral)   Ht 5' 7.5" (1.715 m)   Wt 166 lb 14.4 oz (75.7 kg)   SpO2 98%   BMI 25.75 kg/m  Physical Exam Gen: NAD, alert, cooperative with exam, well-appearing ENT: normal lips, normal nasal mucosa,  Eye: normal EOM, normal conjunctiva and lids CV:  no edema, +2 pedal pulses   Resp: no accessory muscle use, non-labored,  Skin: no rashes, no areas of induration  Neuro: normal tone, normal sensation to touch Psych:  normal insight, alert and oriented MSK:  Back:  No significant pain to palpation of the midline spine in the lumbar and thoracic region. Limited flexion and extension secondary to pain and discomfort. Normal internal and external rotation and internal rotation. Normal strength resistance with hip flexion. Negative straight leg raise. Neurovascular intact     ASSESSMENT & PLAN:   No problem-specific Assessment & Plan notes found for this encounter.   The above documentation has been reviewed and is accurate and complete. Clearance Coots, MD 02/18/2018, 4:22 PM>

## 2018-02-18 NOTE — Addendum Note (Signed)
Addended by: Delia Chimes A on: 02/18/2018 10:20 AM   Modules accepted: Orders

## 2018-02-18 NOTE — Telephone Encounter (Signed)
Medication was approved. Called the pharmacy and they are going to go ahead and fill the medication.

## 2018-02-18 NOTE — Telephone Encounter (Signed)
I placed the referral for the PT today. I also referred her to Spine Surgery for her back. Referrals team can you please follow up on this?

## 2018-02-18 NOTE — Telephone Encounter (Signed)
Forwarding pt's. Message re: PT referral status.

## 2018-02-18 NOTE — Patient Instructions (Signed)
Nice to meet you  I have made referrals to physical therapy and neurosurgery

## 2018-02-19 ENCOUNTER — Encounter: Payer: Self-pay | Admitting: Family Medicine

## 2018-02-19 DIAGNOSIS — S32009A Unspecified fracture of unspecified lumbar vertebra, initial encounter for closed fracture: Secondary | ICD-10-CM | POA: Insufficient documentation

## 2018-02-19 NOTE — Assessment & Plan Note (Signed)
CT demonstrated fracture about 2 weeks ago.  Pain is improved but has not started any physical therapy and has not seen neurosurgery today. -Referral to neurosurgery and physical therapy -DEXA scan.  She reports not having a menstrual cycle over the past year.

## 2018-02-28 ENCOUNTER — Telehealth: Payer: Self-pay | Admitting: Family Medicine

## 2018-02-28 ENCOUNTER — Telehealth: Payer: Self-pay | Admitting: Emergency Medicine

## 2018-02-28 NOTE — Telephone Encounter (Signed)
Copied from [place] [id number]. Topic: Quick Communication - Rx Refill/Question >> Feb 28, 2018  4:26 PM Judyann Munson wrote: Medication: oxyCODONE-acetaminophen (PERCOCET/ROXICET) 5-325 MG tablet   Has the patient contacted their pharmacy?yes   Preferred Pharmacy (with phone number or street name): [id number] - [place], [place] - [phone] (Phone) [phone] (Fax)    Agent: Please be advised that RX refills may take up to 3 business days. We ask that you follow-up with your pharmacy.

## 2018-02-28 NOTE — Telephone Encounter (Signed)
Dr. Thurman Coyer   Copied from [place] [id number]. Topic: General - Other >> Feb 28, 2018  9:36 AM Virl Axe D wrote: Reason for CRM: [facility] called and stated they have called the pt multiple times to try to schedule an appointment from referral sent to them but have not been able to get in contact with pt. Her phone goes straight to vm and it is full. Please advise.

## 2018-02-28 NOTE — Telephone Encounter (Signed)
Patient requesting a refill on Oxycodone. / Medication not listed on medication list

## 2018-02-28 NOTE — Telephone Encounter (Signed)
Med refill - Oxycodone sent to Dr. Nolon Rod Med not on [facility] because it expired 02/22/2018 Last ov 02/15/2018 Given #30 with -0- RF Sig 1 tab q/4hrs prn x 7 days for severe pain / back fx

## 2018-03-01 ENCOUNTER — Telehealth: Payer: Self-pay

## 2018-03-01 MED ORDER — OXYCODONE-ACETAMINOPHEN 5-325 MG PO TABS: 1.0000 | ORAL_TABLET | ORAL | 0 refills | Status: DC | PRN

## 2018-03-01 NOTE — Addendum Note (Signed)
Addended by: Delia Chimes A on: 03/01/2018 08:31 AM   Modules accepted: Orders

## 2018-03-01 NOTE — Telephone Encounter (Signed)
See 03/01/18 telephone note pt notified.

## 2018-03-01 NOTE — Telephone Encounter (Signed)
Spoke with pt via phone advised rx's for oxycodone-acetaminophen 5-325 mg #30 with 0 refills ready for pickup.  4 scripts dated for 12/6, 12/14, 12/21, 12/28. Pt verbalized understanding and will p/u at front desk. Dgaddy, CMA

## 2018-03-01 NOTE — Telephone Encounter (Signed)
Patient has a fractured spine. She is advised to take percocet for pain. Will give one months supply and start to taper off.  Her insurance will only fill a week at a time thus had to change prescription from one month's supply to weekly.  Printed 4 weeks worth on paper with the following fill dates 03/01/18, 03/09/2018, 03/16/2018 and 03/23/2018.  Patient will be notified to pick up.   Will route to team to contact the patient to pick up this medication.

## 2018-03-11 ENCOUNTER — Encounter: Payer: Self-pay | Admitting: Family Medicine

## 2018-03-11 ENCOUNTER — Other Ambulatory Visit: Payer: Self-pay

## 2018-03-11 ENCOUNTER — Ambulatory Visit: Payer: BC Managed Care – PPO | Admitting: Family Medicine

## 2018-03-11 VITALS — BP 121/83 | HR 78 | Temp 98.2°F | Resp 14 | Ht 68.0 in | Wt 171.6 lb

## 2018-03-11 DIAGNOSIS — S32009D Unspecified fracture of unspecified lumbar vertebra, subsequent encounter for fracture with routine healing: Secondary | ICD-10-CM

## 2018-03-11 DIAGNOSIS — M5442 Lumbago with sciatica, left side: Secondary | ICD-10-CM

## 2018-03-11 NOTE — Progress Notes (Signed)
Established Patient Office Visit  Subjective:  Patient ID: Laurie Lewis, female    DOB: 1975/12/19  Age: 42 y.o. MRN: [id number]  CC:  Chief Complaint  Patient presents with  . Back Pain    back pain is much better since last visit. Started PE on 03/08/18 back has been a little aggrivated since starting PE, Can not sit for a long period of time, able to walk with no problem    HPI Laurie Lewis presents for   She reports that after doing physical therapy she has more aggravation of her back pain She reports that she was going to the gym and was moving very little  She reports that the left low back pain that was radiating down the left is resolved She states that she has improved tremendously She states Thursday she was doing better She would give her pain a 3/10 She states that she states that the pain is fairly constant She states that she feels like she is doing clinically better She only did a few exercises, but admits that she tried to run one day very slowly on Friday when her pain started  Past Medical History:  Diagnosis Date  . Anxiety   . Depression   . Dysmenorrhea   . Eczema   . History of bleeding peptic ulcer    age 61--    . History of chlamydia   . History of endometriosis 2008  . History of uterine fibroid   . Infertility, female   . Menorrhagia     Past Surgical History:  Procedure Laterality Date  . DILITATION & CURRETTAGE/HYSTROSCOPY WITH NOVASURE ABLATION N/A 10/10/2016   Procedure: DILATATION & CURETTAGE/HYSTEROSCOPY WITH NOVASURE ABLATION;  Surgeon: Sherlyn Hay, DO;  Location: [facility];  Service: Gynecology;  Laterality: N/A;  . IR RADIOLOGIST EVAL & MGMT  09/19/2016  . WISDOM TOOTH EXTRACTION  age 41    Family History  Problem Relation Age of Onset  . Breast cancer Mother 55  . Diabetes Mother   . Hypertension Mother   . Stroke Mother   . Cancer Father 36       dec--old age at 59  . Diabetes Father   . Stroke  Father   . Breast cancer Maternal Grandmother 67       dec  . Diabetes Paternal Grandfather     Social History   Socioeconomic History  . Marital status: Unknown    Spouse name: Not on file  . Number of children: Not on file  . Years of education: Not on file  . Highest education level: Not on file  Occupational History  . Not on file  Social Needs  . Financial resource strain: Not on file  . Food insecurity:    Worry: Not on file    Inability: Not on file  . Transportation needs:    Medical: Not on file    Non-medical: Not on file  Tobacco Use  . Smoking status: Former Smoker    Packs/day: 0.50    Years: 20.00    Pack years: 10.00    Types: Cigarettes    Last attempt to quit: 06/28/2009    Years since quitting: 8.7  . Smokeless tobacco: Never Used  Substance and Sexual Activity  . Alcohol use: Yes    Alcohol/week: 5.0 standard drinks    Types: 5 Standard drinks or equivalent per week    Comment: occassional  . Drug use: No  . Sexual activity: Yes  Partners: Female, Female    Birth control/protection: Condom    Comment: current partner female(2016)  Lifestyle  . Physical activity:    Days per week: Not on file    Minutes per session: Not on file  . Stress: Not on file  Relationships  . Social connections:    Talks on phone: Not on file    Gets together: Not on file    Attends religious service: Not on file    Active member of club or organization: Not on file    Attends meetings of clubs or organizations: Not on file    Relationship status: Not on file  . Intimate partner violence:    Fear of current or ex partner: Not on file    Emotionally abused: Not on file    Physically abused: Not on file    Forced sexual activity: Not on file  Other Topics Concern  . Not on file  Social History Narrative  . Not on file    Outpatient Medications Prior to Visit  Medication Sig Dispense Refill  . ALPRAZolam (NIRAVAM) 0.5 MG dissolvable tablet Take 1 tablet (0.5 mg  total) by mouth at bedtime as needed for anxiety. 20 tablet 0  . ibuprofen (ADVIL,MOTRIN) 800 MG tablet Take 1 tablet (800 mg total) by mouth every 6 (six) hours as needed. 120 tablet 0  . Melatonin 3 MG SUBL Place under the tongue as needed.    . Misc Natural Products (DEEP SLEEP PO) Take by mouth as needed.    Derrill Memo ON 03/23/2018] oxyCODONE-acetaminophen (PERCOCET/ROXICET) 5-325 MG tablet Take 1 tablet by mouth every 4 (four) hours as needed for up to 7 days for severe pain. 30 tablet 0  . TRYPTOPHAN PO Take by mouth daily as needed.    [name] VALERIAN PO Take by mouth as needed.     No facility-administered medications prior to visit.     Allergies  Allergen Reactions  . Benadryl [Diphenhydramine] Other (See Comments)    "Hypes her up"  . Epinephrine Other (See Comments)    "Hypes her up"--patient doesn't tolerate well    ROS Review of Systems Review of Systems  Constitutional: Negative for activity change, appetite change, chills and fever.  HENT: Negative for congestion, nosebleeds, trouble swallowing and voice change.   Respiratory: Negative for cough, shortness of breath and wheezing.   Gastrointestinal: Negative for diarrhea, nausea and vomiting.  Genitourinary: Negative for difficulty urinating, dysuria, flank pain and hematuria.  Musculoskeletal: see hpi Neurological: see hpi See HPI. All other review of systems negative.     Objective:    Physical Exam  BP 121/83 (BP Location: Right Arm, Patient Position: Sitting, Cuff Size: Normal)   Pulse 78   Temp 98.2 F (36.8 C) (Oral)   Resp 14   Ht 5\' 8"  (1.727 m)   Wt 171 lb 9.6 oz (77.8 kg)   SpO2 97%   BMI 26.09 kg/m  Wt Readings from Last 3 Encounters:  03/11/18 171 lb 9.6 oz (77.8 kg)  02/18/18 166 lb 14.4 oz (75.7 kg)  02/07/18 163 lb 12.8 oz (74.3 kg)   Physical Exam  Constitutional: Oriented to person, place, and time. Appears well-developed and well-nourished.  HENT:  Head: Normocephalic and atraumatic.    Eyes: Conjunctivae and EOM are normal.  Cardiovascular: Normal rate, regular rhythm, normal heart sounds and intact distal pulses.  No murmur heard. Pulmonary/Chest: Effort normal and breath sounds normal. No stridor. No respiratory distress. Has no wheezes.  Neurological:  Is alert and oriented to person, place, and time.  Skin: Skin is warm. Capillary refill takes less than 2 seconds.  Psychiatric: Has a normal mood and affect. Behavior is normal. Judgment and thought content normal.   Low back exam  Tenderness over the SI joints bilaterally Tenderness over the lumbar spinous processes    Health Maintenance Due  Topic Date Due  . PAP SMEAR-Modifier  01/06/1996    There are no preventive care reminders to display for this patient.  No results found for: TSH Lab Results  Component Value Date   WBC 6.5 08/03/2017   HGB 13.8 08/03/2017   HCT 41.1 08/03/2017   MCV 93 08/03/2017   PLT 291 08/03/2017   Lab Results  Component Value Date   NA 139 08/03/2017   K 4.3 08/03/2017   CO2 23 08/03/2017   GLUCOSE 93 08/03/2017   BUN 10 08/03/2017   CREATININE 0.82 08/03/2017   BILITOT 0.5 08/03/2017   ALKPHOS 46 08/03/2017   AST 20 08/03/2017   ALT 14 08/03/2017   PROT 7.1 08/03/2017   ALBUMIN 4.4 08/03/2017   CALCIUM 9.6 08/03/2017   Lab Results  Component Value Date   CHOL 194 08/03/2017   Lab Results  Component Value Date   HDL 89 08/03/2017   Lab Results  Component Value Date   LDLCALC 94 08/03/2017   Lab Results  Component Value Date   TRIG 56 08/03/2017   Lab Results  Component Value Date   CHOLHDL 2.2 08/03/2017   Lab Results  Component Value Date   HGBA1C 5.1 08/03/2017      Assessment & Plan:   Problem List Items Addressed This Visit      Musculoskeletal and Integument   Closed fracture of transverse process of lumbar vertebra ([place]) - Primary   Relevant Orders   CT Lumbar Spine Wo Contrast    Other Visit Diagnoses    Acute left-sided low  back pain with left-sided sciatica       Relevant Orders   CT Lumbar Spine Wo Contrast      Would like to repeat CT lumbar spine to assess healing She is more comfortable than her initial exam but this very athletic patient may have overexerted herself during her return to exercise  Continue PT recs Slow return to activity advised Continue prn pain meds  No orders of the defined types were placed in this encounter.   Follow-up: No follow-ups on file.    Forrest Moron, MD

## 2018-03-11 NOTE — Patient Instructions (Signed)
° ° ° °  If you have lab work done today you will be contacted with your lab results within the next 2 weeks.  If you have not heard from us then please contact us. The fastest way to get your results is to register for My Chart. ° ° °IF you received an x-ray today, you will receive an invoice from [facility]. Please contact [facility] at [phone] with questions or concerns regarding your invoice.  ° °IF you received labwork today, you will receive an invoice from [facility]. Please contact [facility] at [phone] with questions or concerns regarding your invoice.  ° °Our billing staff will not be able to assist you with questions regarding bills from these companies. ° °You will be contacted with the lab results as soon as they are available. The fastest way to get your results is to activate your My Chart account. Instructions are located on the last page of this paperwork. If you have not heard from us regarding the results in 2 weeks, please contact this office. °  ° ° ° °

## 2018-03-18 ENCOUNTER — Other Ambulatory Visit: Payer: Self-pay

## 2018-06-03 ENCOUNTER — Ambulatory Visit: Payer: BC Managed Care – PPO | Admitting: Family Medicine

## 2018-06-17 ENCOUNTER — Other Ambulatory Visit: Payer: Self-pay | Admitting: Emergency Medicine

## 2018-06-18 MED ORDER — OXYCODONE-ACETAMINOPHEN 5-325 MG PO TABS: 1.0000 | ORAL_TABLET | ORAL | 0 refills | Status: DC | PRN

## 2018-06-18 MED ORDER — OXYCODONE-ACETAMINOPHEN 5-325 MG PO TABS: 1.0000 | ORAL_TABLET | ORAL | 0 refills | Status: AC | PRN

## 2018-06-18 NOTE — Addendum Note (Signed)
Addended by: Delia Chimes A on: 06/18/2018 12:10 PM   Modules accepted: Orders

## 2018-06-18 NOTE — Telephone Encounter (Signed)
Medication sent electronically 

## 2018-07-15 ENCOUNTER — Telehealth: Payer: Self-pay | Admitting: Emergency Medicine

## 2018-07-15 NOTE — Telephone Encounter (Signed)
Copied from [facility] [id number]. Topic: Quick Communication - Rx Refill/Question >> Jul 15, 2018 11:44 AM Richardo Priest, NT wrote: Medication: oxyCODONE-acetaminophen (PERCOCET/ROXICET) 5-325 MG tablet  Has the patient contacted their pharmacy? Yes, patient states Dr.Stallings saw her for back injury and it has not gotten better. Patient requesting refill.  Preferred Pharmacy (with phone number or street name):  [id number] - [place], [place] - [address] [phone] (Phone) [phone] (Fax)  Agent: Please be advised that RX refills may take up to 3 business days. We ask that you follow-up with your pharmacy.

## 2018-07-15 NOTE — Telephone Encounter (Signed)
Please advise 

## 2018-07-22 ENCOUNTER — Other Ambulatory Visit: Payer: Self-pay | Admitting: Emergency Medicine

## 2018-07-22 NOTE — Telephone Encounter (Signed)
Canceling request for Oxycodone.

## 2018-08-19 NOTE — Telephone Encounter (Signed)
Going forward I will not be refilling this medication.  She needs to see Orthopedics.

## 2018-09-17 ENCOUNTER — Other Ambulatory Visit: Payer: Self-pay | Admitting: Neurosurgery

## 2018-09-17 DIAGNOSIS — S32009A Unspecified fracture of unspecified lumbar vertebra, initial encounter for closed fracture: Secondary | ICD-10-CM

## 2018-09-17 DIAGNOSIS — Z1231 Encounter for screening mammogram for malignant neoplasm of breast: Secondary | ICD-10-CM

## 2018-10-28 ENCOUNTER — Ambulatory Visit
Admission: RE | Admit: 2018-10-28 | Discharge: 2018-10-28 | Disposition: A | Discharge status: Home / Self Care | Payer: BC Managed Care – PPO | Source: Ambulatory Visit | Attending: Neurosurgery | Admitting: Neurosurgery

## 2018-10-28 ENCOUNTER — Other Ambulatory Visit: Payer: Self-pay

## 2018-10-28 DIAGNOSIS — Z1231 Encounter for screening mammogram for malignant neoplasm of breast: Secondary | ICD-10-CM

## 2018-10-28 DIAGNOSIS — S32009A Unspecified fracture of unspecified lumbar vertebra, initial encounter for closed fracture: Secondary | ICD-10-CM

## 2018-11-01 ENCOUNTER — Other Ambulatory Visit: Payer: Self-pay | Admitting: Neurosurgery

## 2018-11-01 DIAGNOSIS — R928 Other abnormal and inconclusive findings on diagnostic imaging of breast: Secondary | ICD-10-CM

## 2018-11-04 ENCOUNTER — Other Ambulatory Visit: Payer: Self-pay | Admitting: Obstetrics and Gynecology

## 2018-11-04 DIAGNOSIS — R928 Other abnormal and inconclusive findings on diagnostic imaging of breast: Secondary | ICD-10-CM

## 2018-11-08 ENCOUNTER — Ambulatory Visit: Payer: BC Managed Care – PPO

## 2018-11-08 ENCOUNTER — Ambulatory Visit
Admission: RE | Admit: 2018-11-08 | Discharge: 2018-11-08 | Disposition: A | Discharge status: Home / Self Care | Payer: BC Managed Care – PPO | Source: Ambulatory Visit | Attending: Neurosurgery | Admitting: Neurosurgery

## 2018-11-08 ENCOUNTER — Other Ambulatory Visit: Payer: Self-pay

## 2018-11-08 DIAGNOSIS — R928 Other abnormal and inconclusive findings on diagnostic imaging of breast: Secondary | ICD-10-CM

## 2018-11-19 ENCOUNTER — Other Ambulatory Visit: Payer: BC Managed Care – PPO

## 2019-10-28 ENCOUNTER — Other Ambulatory Visit: Payer: Self-pay | Admitting: Obstetrics and Gynecology

## 2019-10-28 DIAGNOSIS — Z1231 Encounter for screening mammogram for malignant neoplasm of breast: Secondary | ICD-10-CM

## 2019-10-31 ENCOUNTER — Ambulatory Visit
Admission: RE | Admit: 2019-10-31 | Discharge: 2019-10-31 | Disposition: A | Discharge status: Home / Self Care | Payer: BC Managed Care – PPO | Source: Ambulatory Visit

## 2019-10-31 ENCOUNTER — Other Ambulatory Visit: Payer: Self-pay

## 2019-10-31 DIAGNOSIS — Z1231 Encounter for screening mammogram for malignant neoplasm of breast: Secondary | ICD-10-CM

## 2020-10-13 IMAGING — MG DIGITAL SCREENING BILAT W/ TOMO W/ CAD
6 of 10 series · 6 of 30 positions shown · non-contrast
Comparison: Previous exam(s).

CLINICAL DATA: Screening.

EXAM:
DIGITAL SCREENING BILATERAL MAMMOGRAM WITH TOMO AND CAD

[R MLO synth-2D (1 of 2)]
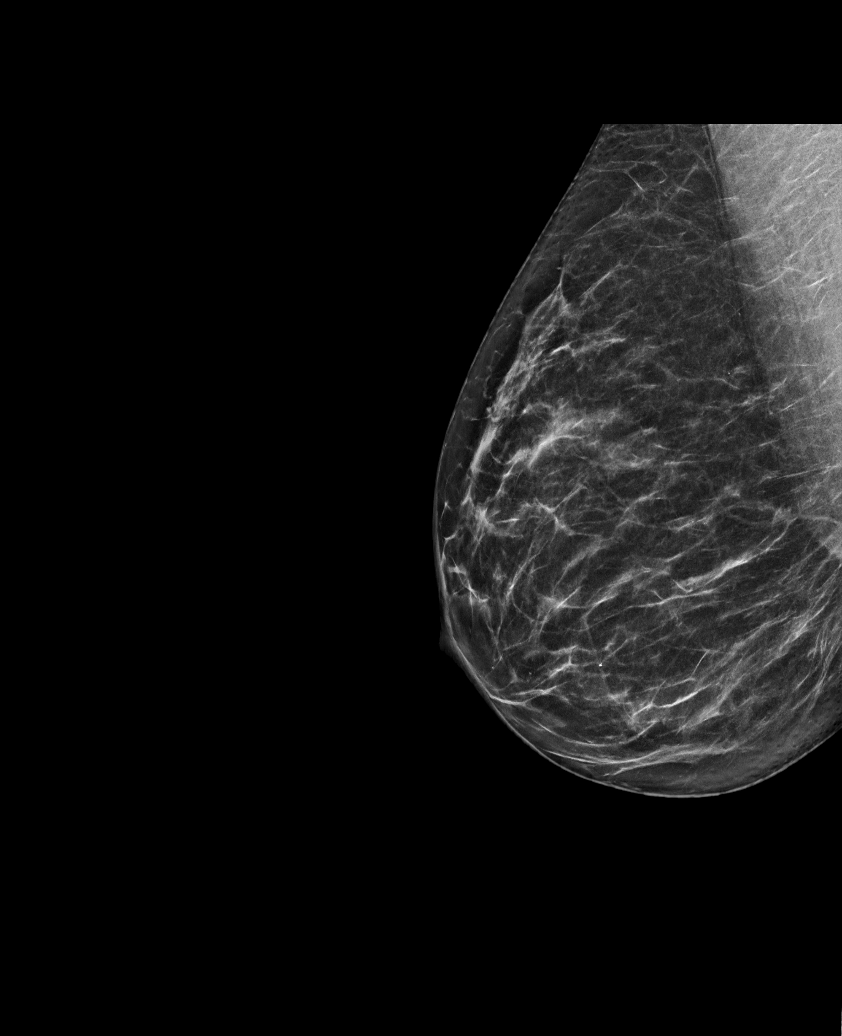

[L MLO synth-2D]
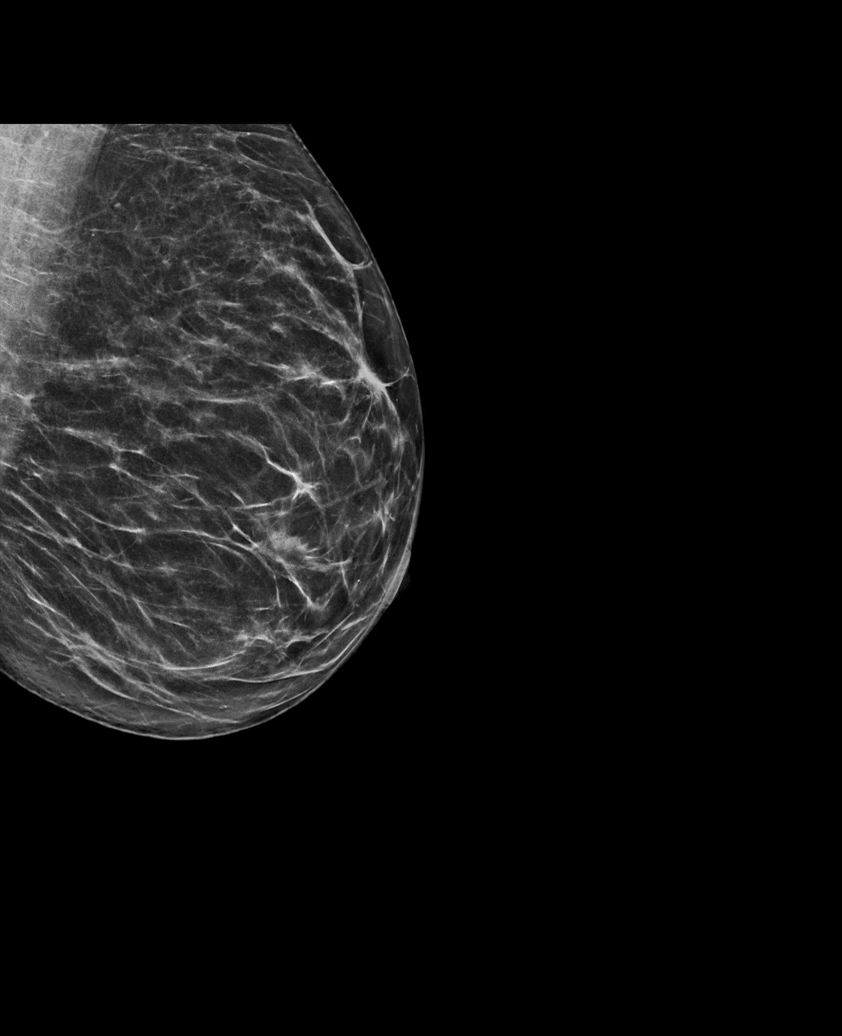

[R MLO synth-2D (2 of 2)]
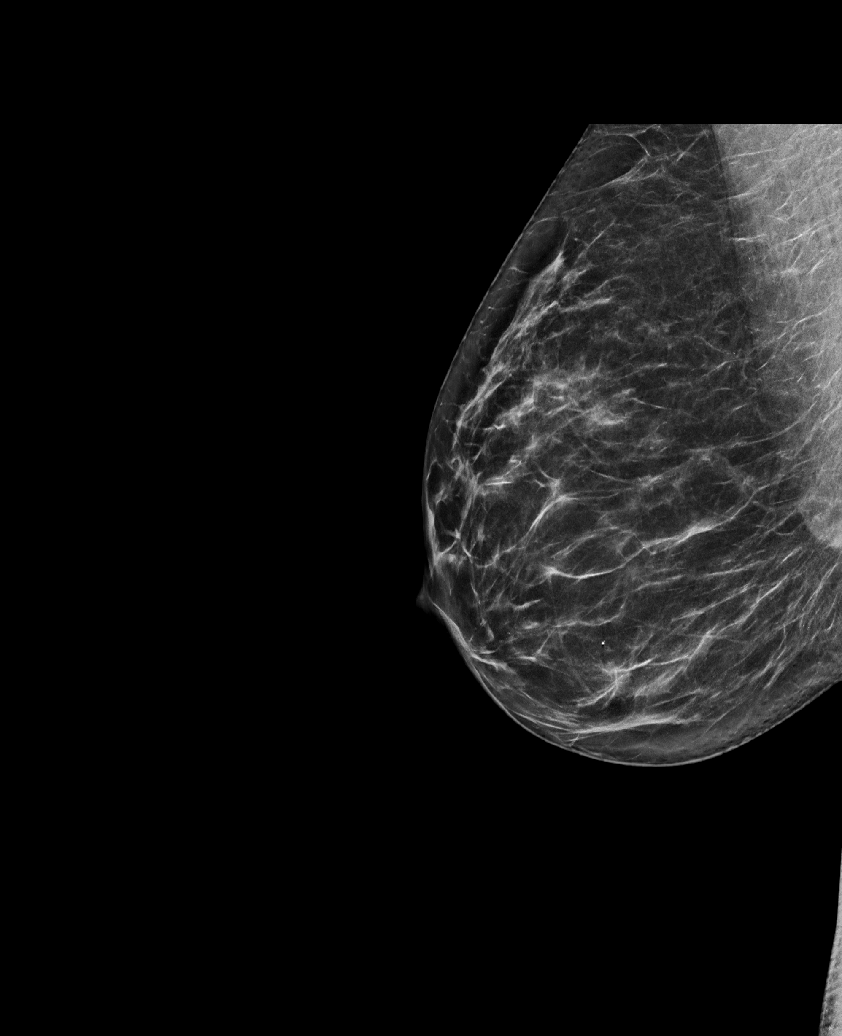

[R CC synth-2D]
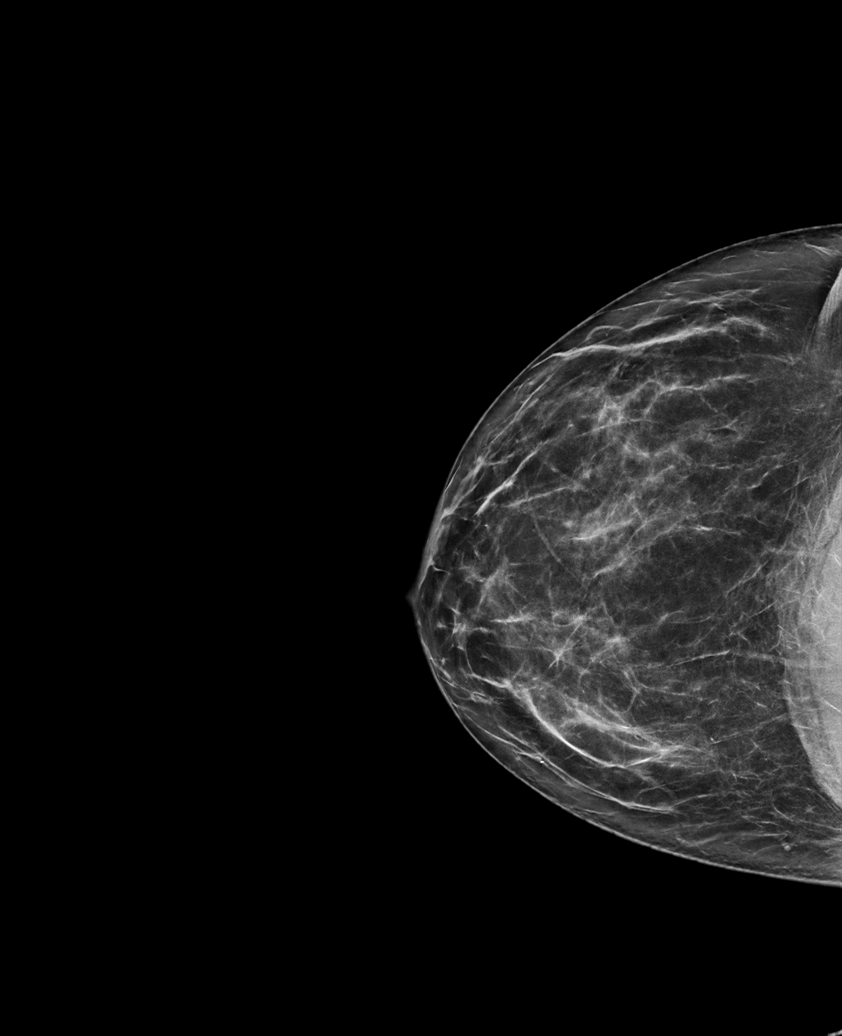

[L CC synth-2D]
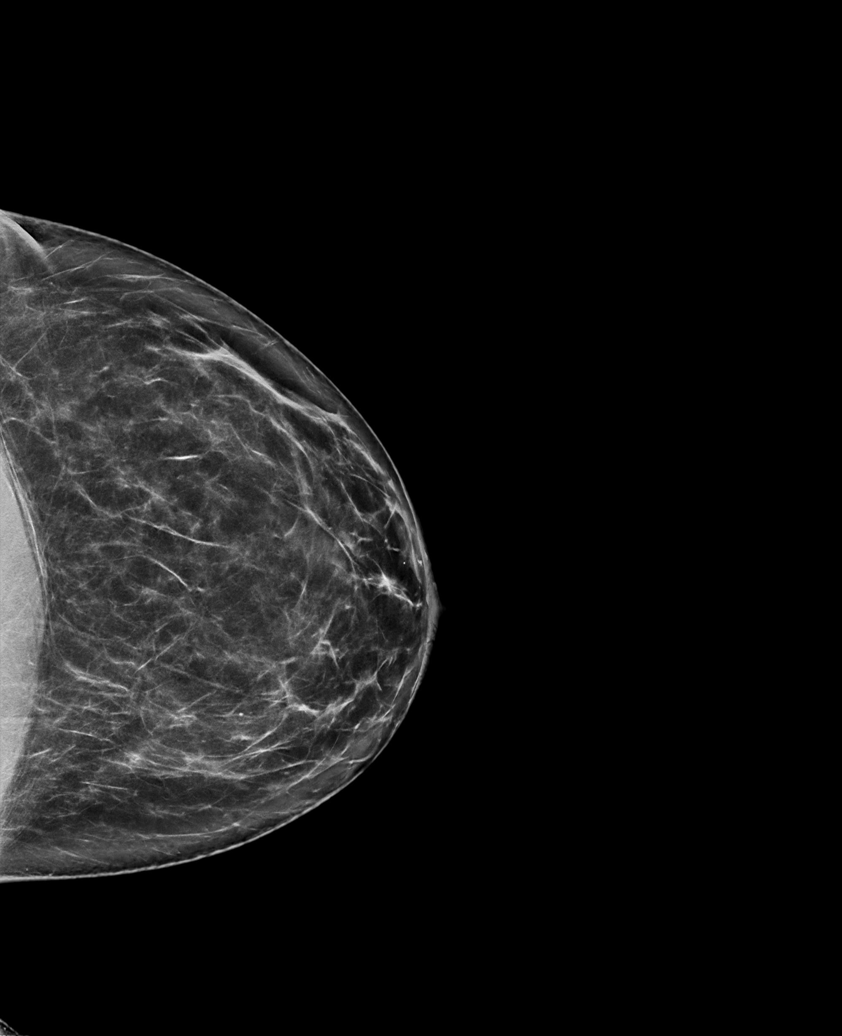

[R CC tomo · tomo slice 37/73.0]
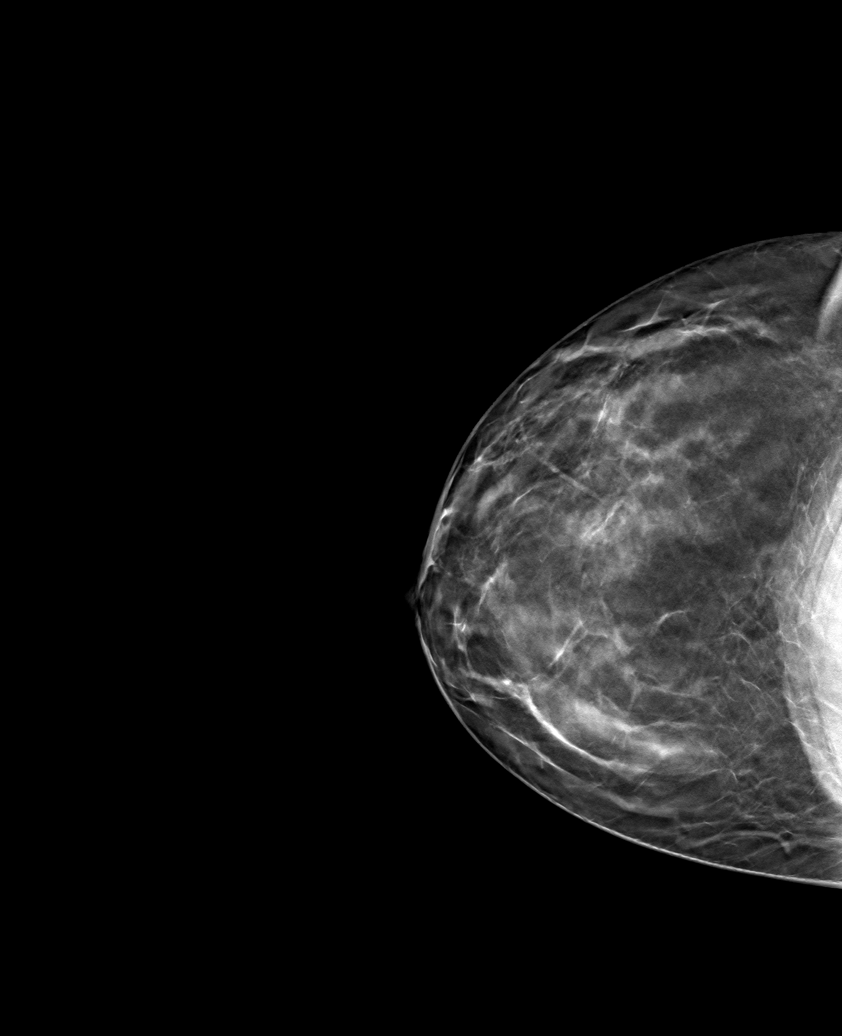

[6 of 30 positions shown; findings below may reference images not displayed]

ACR Breast Density Category b: There are scattered areas of
fibroglandular density.
FINDINGS: There are no findings suspicious for malignancy. Images were
processed with CAD.
IMPRESSION: No mammographic evidence of malignancy. A result letter of this
screening mammogram will be mailed directly to the patient.

RECOMMENDATION:
Screening mammogram in one year. (Code:[id number])

BI-RADS CATEGORY  1: Negative.

## 2021-02-02 ENCOUNTER — Ambulatory Visit (HOSPITAL_COMMUNITY)
Admission: EM | Admit: 2021-02-02 | Discharge: 2021-02-02 | Disposition: A | Discharge status: Home / Self Care | Payer: BC Managed Care – PPO | Attending: Physician Assistant | Admitting: Physician Assistant

## 2021-02-02 ENCOUNTER — Other Ambulatory Visit: Payer: Self-pay

## 2021-02-02 ENCOUNTER — Encounter (HOSPITAL_COMMUNITY): Payer: Self-pay | Admitting: Emergency Medicine

## 2021-02-02 DIAGNOSIS — R42 Dizziness and giddiness: Secondary | ICD-10-CM | POA: Diagnosis not present

## 2021-02-02 DIAGNOSIS — R002 Palpitations: Secondary | ICD-10-CM | POA: Insufficient documentation

## 2021-02-02 DIAGNOSIS — R55 Syncope and collapse: Secondary | ICD-10-CM | POA: Diagnosis not present

## 2021-02-02 LAB — CBC WITH DIFFERENTIAL/PLATELET
Abs Immature Granulocytes: 0.01 10*3/uL (ref 0.00–0.07)
Basophils Absolute: 0 10*3/uL (ref 0.0–0.1)
Basophils Relative: 1 %
Eosinophils Absolute: 0.2 10*3/uL (ref 0.0–0.5)
Eosinophils Relative: 3 %
HCT: 36.1 % (ref 36.0–46.0)
Hemoglobin: 12.3 g/dL (ref 12.0–15.0)
Immature Granulocytes: 0 %
Lymphocytes Relative: 51 %
Lymphs Abs: 2.4 10*3/uL (ref 0.7–4.0)
MCH: 30.9 pg (ref 26.0–34.0)
MCHC: 34.1 g/dL (ref 30.0–36.0)
MCV: 90.7 fL (ref 80.0–100.0)
Monocytes Absolute: 0.4 10*3/uL (ref 0.1–1.0)
Monocytes Relative: 9 %
Neutro Abs: 1.7 10*3/uL (ref 1.7–7.7)
Neutrophils Relative %: 36 %
Platelets: 228 10*3/uL (ref 150–400)
RBC: 3.98 MIL/uL (ref 3.87–5.11)
RDW: 11.7 % (ref 11.5–15.5)
WBC: 4.7 10*3/uL (ref 4.0–10.5)
nRBC: 0 % (ref 0.0–0.2)

## 2021-02-02 LAB — POCT URINALYSIS DIPSTICK, ED / UC
Bilirubin Urine: NEGATIVE
Glucose, UA: NEGATIVE mg/dL
Hgb urine dipstick: NEGATIVE
Ketones, ur: NEGATIVE mg/dL
Leukocytes,Ua: NEGATIVE
Nitrite: NEGATIVE
Protein, ur: NEGATIVE mg/dL
Specific Gravity, Urine: 1.01 (ref 1.005–1.030)
Urobilinogen, UA: 0.2 mg/dL (ref 0.0–1.0)
pH: 7 (ref 5.0–8.0)

## 2021-02-02 LAB — CBG MONITORING, ED: Glucose-Capillary: 91 mg/dL (ref 70–99)

## 2021-02-02 LAB — COMPREHENSIVE METABOLIC PANEL
ALT: 12 U/L (ref 0–44)
AST: 17 U/L (ref 15–41)
Albumin: 3.7 g/dL (ref 3.5–5.0)
Alkaline Phosphatase: 53 U/L (ref 38–126)
Anion gap: 7 (ref 5–15)
BUN: 14 mg/dL (ref 6–20)
CO2: 26 mmol/L (ref 22–32)
Calcium: 8.7 mg/dL — ABNORMAL LOW (ref 8.9–10.3)
Chloride: 101 mmol/L (ref 98–111)
Creatinine, Ser: 0.7 mg/dL (ref 0.44–1.00)
GFR, Estimated: >60 mL/min (ref >60)
Glucose, Bld: 86 mg/dL (ref 70–99)
Potassium: 3.6 mmol/L (ref 3.5–5.1)
Sodium: 134 mmol/L — ABNORMAL LOW (ref 135–145)
Total Bilirubin: 0.6 mg/dL (ref 0.3–1.2)
Total Protein: 6.6 g/dL (ref 6.5–8.1)

## 2021-02-02 LAB — TSH: TSH: 2.047 u[IU]/mL (ref 0.350–4.500)

## 2021-02-02 MED ORDER — LIDOCAINE VISCOUS HCL 2 % MT SOLN
OROMUCOSAL | Status: AC
  Filled 2021-02-02: qty 15

## 2021-02-02 MED ORDER — ALUM & MAG HYDROXIDE-SIMETH 200-200-20 MG/5ML PO SUSP
30.0000 mL | Freq: Once | ORAL | Status: AC
  Administered 2021-02-02: 30 mL via ORAL

## 2021-02-02 MED ORDER — LIDOCAINE VISCOUS HCL 2 % MT SOLN
15.0000 mL | Freq: Once | OROMUCOSAL | Status: AC
  Administered 2021-02-02: 15 mL via ORAL

## 2021-02-02 MED ORDER — ALUM & MAG HYDROXIDE-SIMETH 200-200-20 MG/5ML PO SUSP
ORAL | Status: AC
  Filled 2021-02-02: qty 30

## 2021-02-02 NOTE — ED Notes (Signed)
Notified provider of patient's preference not to take lidocaine

## 2021-02-02 NOTE — ED Provider Notes (Signed)
[facility]    CSN: [id number] Arrival date & time: 02/02/21  1549      History   Chief Complaint Chief Complaint  Patient presents with   Palpitations    HPI Laurie Lewis is a 45 y.o. adult.   Patient presents today with a 1 day history of episodic lightheadedness.  Reports that earlier today she felt an unusual palpitation that lasted for several seconds then she felt she was going to pass out but denies syncopal episode.  She initially attributed this to not having eaten much so went home and ate some food which then caused severe acid reflux.  She has not had any near syncopal episodes since that time but continues to have lightheadedness.  She denies any weakness, nausea, vomiting, vision changes, dysarthria.  Denies chest pain but does report having some shortness of breath earlier today.  She has not tried any over-the-counter medication for symptom management.  Denies any recent illness or cough or congestion symptoms.  She does have a history of peptic ulcer disease when she was much younger.  She denies any hematuria or melena.  Does report episode of hematochezia approximately a month ago related to hemorrhoids but this is since resolved.  She denies history of arrhythmia, chronic liver/kidney disease, asthma or COPD, cardiovascular disease.   Past Medical History:  Diagnosis Date   Anxiety    Depression    Dysmenorrhea    Eczema    History of bleeding peptic ulcer    age 48--     History of chlamydia    History of endometriosis 2008   History of uterine fibroid    Infertility, female    Menorrhagia     Patient Active Problem List   Diagnosis Date Noted   Closed fracture of transverse process of lumbar vertebra ([place]) 02/19/2018   Menorrhagia 10/10/2016   Dysmenorrhea 10/10/2016   Endometriosis 10/10/2016   Fibroids 10/10/2016   Status post hysteroscopic ablation of endometrium 10/10/2016   PEPTIC ULCER DISEASE 01/31/2010    Past Surgical History:   Procedure Laterality Date   DILITATION & CURRETTAGE/HYSTROSCOPY WITH NOVASURE ABLATION N/A 10/10/2016   Procedure: DILATATION & CURETTAGE/HYSTEROSCOPY WITH NOVASURE ABLATION;  Surgeon: Sherlyn Hay, DO;  Location: [facility];  Service: Gynecology;  Laterality: N/A;   IR RADIOLOGIST EVAL & MGMT  09/19/2016   WISDOM TOOTH EXTRACTION  age 70    OB History     Gravida  0   Para  0   Term  0   Preterm  0   AB  0   Living  0      SAB  0   IAB  0   Ectopic  0   Multiple  0   Live Births               Home Medications    Prior to Admission medications   Medication Sig Start Date End Date Taking? Authorizing Provider  cyclobenzaprine (FLEXERIL) 10 MG tablet Take 10 mg by mouth at bedtime as needed. 01/28/21  Yes [provider]  ALPRAZolam (NIRAVAM) 0.5 MG dissolvable tablet Take 1 tablet (0.5 mg total) by mouth at bedtime as needed for anxiety. Patient not taking: Reported on 02/02/2021 07/17/17   Horald Pollen, MD  ibuprofen (ADVIL,MOTRIN) 800 MG tablet Take 1 tablet (800 mg total) by mouth every 6 (six) hours as needed. 02/07/18   Forrest Moron, MD  Melatonin 3 MG SUBL Place under the tongue  as needed.    [provider]  Misc Natural Products (DEEP SLEEP PO) Take by mouth as needed.    [provider]  TRYPTOPHAN PO Take by mouth daily as needed.    [provider]  VALERIAN PO Take by mouth as needed.    [provider]    Family History Family History  Problem Relation Age of Onset   Breast cancer Mother 34   Diabetes Mother    Hypertension Mother    Stroke Mother    Cancer Father 38       dec--old age at 35   Diabetes Father    Stroke Father    Breast cancer Maternal Grandmother 84       dec   Diabetes Paternal Grandfather     Social History Social History   Tobacco Use   Smoking status: Former    Packs/day: 0.50    Years: 20.00    Pack years: 10.00    Types:  Cigarettes    Quit date: 06/28/2009    Years since quitting: 11.6   Smokeless tobacco: Never  Vaping Use   Vaping Use: Former  Substance Use Topics   Alcohol use: Yes    Alcohol/week: 5.0 standard drinks    Types: 5 Standard drinks or equivalent per week    Comment: occassional   Drug use: No     Allergies   Benadryl [diphenhydramine] and Epinephrine   Review of Systems Review of Systems  Constitutional:  Positive for activity change and fatigue. Negative for appetite change and fever.  HENT:  Negative for congestion, sinus pressure, sneezing and sore throat.   Eyes:  Negative for photophobia and visual disturbance.  Respiratory:  Positive for shortness of breath. Negative for cough.   Cardiovascular:  Negative for chest pain.  Gastrointestinal:  Positive for abdominal pain and nausea. Negative for diarrhea and vomiting.  Neurological:  Positive for light-headedness. Negative for dizziness, speech difficulty, weakness, numbness and headaches.    Physical Exam Triage Vital Signs ED Triage Vitals  Enc Vitals Group     BP 02/02/21 1711 111/75     Pulse Rate 02/02/21 1711 68     Resp 02/02/21 1711 18     Temp 02/02/21 1711 98.9 F (37.2 C)     Temp Source 02/02/21 1711 Oral     SpO2 --      Weight --      Height --      Head Circumference --      Peak Flow --      Pain Score 02/02/21 1708 3     Pain Loc --      Pain Edu? --      Excl. in [place]? --    Orthostatic VS for the past 24 hrs:  BP- Lying Pulse- Lying BP- Sitting Pulse- Sitting BP- Standing at 0 minutes Pulse- Standing at 0 minutes  02/02/21 1856 100/60 58 109/65 64 134/87 67    Updated Vital Signs BP 111/75 (BP Location: Right Arm)   Pulse 68   Temp 98.9 F (37.2 C) (Oral)   Resp 18   LMP 09/15/2016 (Exact Date)   Visual Acuity Right Eye Distance:   Left Eye Distance:   Bilateral Distance:    Right Eye Near:   Left Eye Near:    Bilateral Near:     Physical Exam Vitals reviewed.   Constitutional:      General: Laurie Lewis is awake. Laurie Lewis is not in acute  distress.    Appearance: Normal appearance. Laurie Lewis is well-developed. Laurie Lewis is not ill-appearing.     Comments: Very pleasant female appears stated age in no acute distress sitting comfortably in exam room.  HENT:     Head: Normocephalic and atraumatic. No raccoon eyes, Battle's sign or contusion.     Right Ear: Tympanic membrane, ear canal and external ear normal. No hemotympanum.     Left Ear: Tympanic membrane, ear canal and external ear normal. No hemotympanum.     Nose: Nose normal.     Mouth/Throat:     Tongue: Tongue does not deviate from midline.     Pharynx: Uvula midline. No oropharyngeal exudate or posterior oropharyngeal erythema.  Eyes:     Extraocular Movements: Extraocular movements intact.     Pupils: Pupils are equal, round, and reactive to light.  Cardiovascular:     Rate and Rhythm: Normal rate and regular rhythm.     Heart sounds: Normal heart sounds, S1 normal and S2 normal. No murmur heard. Pulmonary:     Effort: Pulmonary effort is normal.     Breath sounds: Normal breath sounds. No wheezing, rhonchi or rales.     Comments: Clear to auscultation bilaterally Abdominal:     Palpations: Abdomen is soft.     Tenderness: There is no abdominal tenderness.  Musculoskeletal:     Cervical back: Normal range of motion and neck supple. No spinous process tenderness or muscular tenderness.     Comments: Strength 5/5 bilateral upper and lower extremities  Neurological:     General: No focal deficit present.     Cranial Nerves: Cranial nerves 2-12 are intact.     Motor: Motor function is intact.     Coordination: Coordination is intact.     Gait: Gait is intact.  Psychiatric:        Behavior: Behavior is cooperative.     UC Treatments / Results  Labs (all labs ordered are listed, but only abnormal results are displayed) Labs Reviewed  COMPREHENSIVE METABOLIC PANEL -  Abnormal; Notable for the following components:      Result Value   Sodium 134 (*)    Calcium 8.7 (*)    All other components within normal limits  CBC WITH DIFFERENTIAL/PLATELET  TSH  POCT URINALYSIS DIPSTICK, ED / UC  CBG MONITORING, ED    EKG   Radiology No results found.  Procedures Procedures (including critical care time)  Medications Ordered in UC Medications  alum & mag hydroxide-simeth (MAALOX/MYLANTA) 200-200-20 MG/5ML suspension 30 mL (30 mLs Oral Given 02/02/21 1757)    And  lidocaine (XYLOCAINE) 2 % viscous mouth solution 15 mL (15 mLs Oral Given 02/02/21 1757)    Initial Impression / Assessment and Plan / UC Course  I have reviewed the triage vital signs and the nursing notes.  Pertinent labs & imaging results that were available during my care of the patient were reviewed by me and considered in my medical decision making (see chart for details).     EKG obtained showed normal sinus rhythm with ventricular rate of 61 bpm without ischemic changes; no previous to compare.  Patient was given Maalox with improvement of symptoms.  CBC was obtained that showed no evidence of anemia.  She was mildly hyponatremic and we discussed limiting free water and eating more salt.  Urine showed no evidence of dehydration.  Thyroid was normal.  Orthostatic vital signs were within normal limits.  Discussed that given clinical presentation she should  follow-up with cardiology.  She was given information for local group and encouraged to call to schedule an appointment as it is possible to consider further evaluation including potentially Holter monitor.  Recommended she follow-up with her PCP.  She is to rest and drink plenty of fluid as well as eat small frequent meals.  Discussed alarm symptoms that warrant emergent evaluation including syncopal episode, chest pain, recurrent palpitations, shortness of breath.  Strict return precautions given to which patient expressed  understanding.  Final Clinical Impressions(s) / UC Diagnoses   Final diagnoses:  Palpitations  Episodic lightheadedness  Near syncope     Discharge Instructions      Your work-up was essentially normal.  You are not anemic.  You are not dehydrated.  You do have a mildly low sodium so please increase salt consumption.  We will contact you with your thyroid results.  Given your symptoms I think it is worthwhile to follow-up with a cardiologist.  Please call to schedule appointment with them.  If you have any worsening symptoms including palpitations, chest pain, shortness of breath, passing out you need to go to the emergency room.  Make sure you are drinking plenty of fluid and eat small frequent meals.     ED Prescriptions   None    PDMP not reviewed this encounter.   Terrilee Croak, PA-C 02/02/21 1936

## 2021-02-02 NOTE — ED Triage Notes (Signed)
Patient had an episode of palpitations, followed by feeling light headed, near syncopal.  Patient went home to eat , homemade rice and beans, now is have reflux.  Denies chest pain

## 2021-02-02 NOTE — Discharge Instructions (Signed)
Your work-up was essentially normal.  You are not anemic.  You are not dehydrated.  You do have a mildly low sodium so please increase salt consumption.  We will contact you with your thyroid results.  Given your symptoms I think it is worthwhile to follow-up with a cardiologist.  Please call to schedule appointment with them.  If you have any worsening symptoms including palpitations, chest pain, shortness of breath, passing out you need to go to the emergency room.  Make sure you are drinking plenty of fluid and eat small frequent meals.

## 2021-03-01 NOTE — Progress Notes (Signed)
Chief Complaint  Patient presents with   New Patient (Initial Visit)    Palpitations      History of Present Illness:45 yo female with history of anxiety, depression here today as a new consult, referred by Brantley Stage, PA-C for the evaluation of palpitations. She was seen in the ED at [facility] 02/02/21 with c/o palpitations. TSH normal. EKG with sinus rhythm. The palpitations lasted for a few seconds and she felt dizzy. No recurrence since. No chest pain or dyspnea. She has been an occasional smoker for years but no heavy tobacco abuse. She has stopped smoking. She drinks 1-2 cups of coffee per morning.   Primary Care Physician: Chrystie Nose   Past Medical History:  Diagnosis Date   Anxiety    Depression    Dysmenorrhea    Eczema    History of bleeding peptic ulcer    age 89--     History of chlamydia    History of endometriosis 2008   History of uterine fibroid    Infertility, female    Menorrhagia     Past Surgical History:  Procedure Laterality Date   DILITATION & CURRETTAGE/HYSTROSCOPY WITH NOVASURE ABLATION N/A 10/10/2016   IR RADIOLOGIST EVAL & MGMT  09/19/2016   WISDOM TOOTH EXTRACTION  age 61    Current Outpatient Medications  Medication Sig Dispense Refill   diclofenac (VOLTAREN) 50 MG EC tablet Take 50 mg by mouth 2 (two) times daily as needed.     ibuprofen (ADVIL) 600 MG tablet Take 600 mg by mouth as needed for pain.     Melatonin 3 MG SUBL Place under the tongue as needed.     TRYPTOPHAN PO Take by mouth daily as needed.     ALPRAZolam (NIRAVAM) 0.5 MG dissolvable tablet Take 1 tablet (0.5 mg total) by mouth at bedtime as needed for anxiety. (Patient not taking: Reported on 03/02/2021) 20 tablet 0   cyclobenzaprine (FLEXERIL) 10 MG tablet Take 10 mg by mouth at bedtime as needed. (Patient not taking: Reported on 03/02/2021)     ibuprofen (ADVIL,MOTRIN) 800 MG tablet Take 1 tablet (800 mg total) by mouth every 6 (six) hours as needed. (Patient not  taking: Reported on 03/02/2021) 120 tablet 0   Misc Natural Products (DEEP SLEEP PO) Take by mouth as needed. (Patient not taking: Reported on 03/02/2021)     VALERIAN PO Take by mouth as needed. (Patient not taking: Reported on 03/02/2021)     No current facility-administered medications for this visit.    Allergies  Allergen Reactions   Benadryl [Diphenhydramine] Other (See Comments)    "Hypes her up"   Epinephrine Other (See Comments)    "Hypes her up"--patient doesn't tolerate well    Social History   Socioeconomic History   Marital status: Single    Spouse name: Not on file   Number of children: Not on file   Years of education: Not on file   Highest education level: Not on file  Occupational History   Not on file  Tobacco Use   Smoking status: Former    Packs/day: 0.50    Years: 20.00    Pack years: 10.00    Types: Cigarettes    Quit date: 06/26/2019    Years since quitting: 1.6   Smokeless tobacco: Never  Vaping Use   Vaping Use: Former  Substance and Sexual Activity   Alcohol use: Yes    Alcohol/week: 5.0 standard drinks    Types: 5 Standard drinks  or equivalent per week    Comment: occassional   Drug use: No   Sexual activity: Yes    Partners: Female, Female    Birth control/protection: Condom    Comment: current partner female(2016)  Other Topics Concern   Not on file  Social History Narrative   Not on file   Social Determinants of Health   Financial Resource Strain: Not on file  Food Insecurity: Not on file  Transportation Needs: Not on file  Physical Activity: Not on file  Stress: Not on file  Social Connections: Not on file  Intimate Partner Violence: Not on file    Family History  Problem Relation Age of Onset   Breast cancer Mother 50   Diabetes Mother    Hypertension Mother    Stroke Mother    Cancer Father 70       dec--old age at 39   Diabetes Father    Stroke Father    Breast cancer Maternal Grandmother 84       dec   Diabetes  Paternal Grandfather     Review of Systems:  As stated in the HPI and otherwise negative.   BP 120/68   Pulse 62   Ht 5\' 8"  (1.727 m)   Wt 167 lb 12.8 oz (76.1 kg)   LMP 09/15/2016 (Exact Date)   SpO2 98%   BMI 25.51 kg/m   Physical Examination: General: Well developed, well nourished, NAD  HEENT: OP clear, mucus membranes moist  SKIN: warm, dry. No rashes. Neuro: No focal deficits  Musculoskeletal: Muscle strength 5/5 all ext  Psychiatric: Mood and affect normal  Neck: No JVD, no carotid bruits, no thyromegaly, no lymphadenopathy.  Lungs:Clear bilaterally, no wheezes, rhonci, crackles Cardiovascular: Regular rate and rhythm. No murmurs, gallops or rubs. Abdomen:Soft. Bowel sounds present. Non-tender.  Extremities: No lower extremity edema. Pulses are 2 + in the bilateral DP/PT.  EKG:  EKG is ordered today. The ekg ordered today demonstrates Sinus  Recent Labs: 02/02/2021: ALT 12; BUN 14; Creatinine, Ser 0.70; Hemoglobin 12.3; Platelets 228; Potassium 3.6; Sodium 134; TSH 2.047   Lipid Panel    Component Value Date/Time   CHOL 194 08/03/2017 1128   TRIG 56 08/03/2017 1128   HDL 89 08/03/2017 1128   CHOLHDL 2.2 08/03/2017 1128   LDLCALC 94 08/03/2017 1128     Wt Readings from Last 3 Encounters:  03/02/21 167 lb 12.8 oz (76.1 kg)  03/11/18 171 lb 9.6 oz (77.8 kg)  02/18/18 166 lb 14.4 oz (75.7 kg)      Assessment and Plan:   1. Palpitations: She most likely has premature beats but these are rare. Will arrange a 14 day Zio cardiac monitor. Echo to assess LV function and exclude structural heart disease.   Current medicines are reviewed at length with the patient today.  The patient does not have concerns regarding medicines.  The following changes have been made:  no change  Labs/ tests ordered today include:   Orders Placed This Encounter  Procedures   LONG TERM MONITOR (3-14 DAYS)   EKG 12-Lead   ECHOCARDIOGRAM COMPLETE      Disposition:   F/U with  me prn.    Signed, Lauree Chandler, MD 03/02/2021 10:51 AM    [facility] [address], [place], [place]  [address] Phone: [phone]; Fax: [phone]

## 2021-03-02 ENCOUNTER — Encounter: Payer: Self-pay | Admitting: Cardiovascular Disease

## 2021-03-02 ENCOUNTER — Ambulatory Visit: Payer: BC Managed Care – PPO | Admitting: Cardiovascular Disease

## 2021-03-02 ENCOUNTER — Ambulatory Visit (INDEPENDENT_AMBULATORY_CARE_PROVIDER_SITE_OTHER): Payer: BC Managed Care – PPO

## 2021-03-02 ENCOUNTER — Other Ambulatory Visit: Payer: Self-pay

## 2021-03-02 VITALS — BP 120/68 | HR 62 | Ht 68.0 in | Wt 167.8 lb

## 2021-03-02 DIAGNOSIS — R002 Palpitations: Secondary | ICD-10-CM

## 2021-03-02 NOTE — Patient Instructions (Addendum)
Medication Instructions:  No changes  Lab Work: none  Testing/Procedures: Your physician has requested that you have an echocardiogram. Echocardiography is a painless test that uses sound waves to create images of your heart. It provides your doctor with information about the size and shape of your heart and how well your heart's chambers and valves are working. This procedure takes approximately one hour. There are no restrictions for this procedure.  Zio XT heart monitor (patch) 14 days- see instructions below.  Follow-Up: As needed.   Other Instructions ZIO XT- Long Term Monitor Instructions  Your physician has requested you wear a ZIO patch monitor for 14 days.  This is a single patch monitor. [employer] supplies one patch monitor per enrollment. Additional stickers are not available. Please do not apply patch if you will be having a Nuclear Stress Test,  Echocardiogram, Cardiac CT, MRI, or Chest Xray during the period you would be wearing the  monitor. The patch cannot be worn during these tests. You cannot remove and re-apply the  ZIO XT patch monitor.  Your ZIO patch monitor will be mailed 3 day USPS to your address on file. It may take 3-5 days  to receive your monitor after you have been enrolled.  Once you have received your monitor, please review the enclosed instructions. Your monitor  has already been registered assigning a specific monitor serial # to you.  Billing and Patient Assistance Program Information  We have supplied [employer] with any of your insurance information on file for billing purposes. [employer] offers a sliding scale Patient Assistance Program for patients that do not have  insurance, or whose insurance does not completely cover the cost of the ZIO monitor.  You must apply for the Patient Assistance Program to qualify for this discounted rate.  To apply, please call [employer] at [phone], select option 4, select option 2, ask to apply for  Patient  Assistance Program. Theodore Demark will ask your household income, and how many people  are in your household. They will quote your out-of-pocket cost based on that information.  [employer] will also be able to set up a 20-month, interest-free payment plan if needed.  Applying the monitor   Shave hair from upper left chest.  Hold abrader disc by orange tab. Rub abrader in 40 strokes over the upper left chest as  indicated in your monitor instructions.  Clean area with 4 enclosed alcohol pads. Let dry.  Apply patch as indicated in monitor instructions. Patch will be placed under collarbone on left  side of chest with arrow pointing upward.  Rub patch adhesive wings for 2 minutes. Remove white label marked "1". Remove the white  label marked "2". Rub patch adhesive wings for 2 additional minutes.  While looking in a mirror, press and release button in center of patch. A small green light will  flash 3-4 times. This will be your only indicator that the monitor has been turned on.  Do not shower for the first 24 hours. You may shower after the first 24 hours.  Press the button if you feel a symptom. You will hear a small click. Record Date, Time and  Symptom in the Patient Logbook.  When you are ready to remove the patch, follow instructions on the last 2 pages of Patient  Logbook. Stick patch monitor onto the last page of Patient Logbook.  Place Patient Logbook in the blue and white box. Use locking tab on box and tape box closed  securely. The blue and  white box has prepaid postage on it. Please place it in the mailbox as  soon as possible. Your physician should have your test results approximately 7 days after the  monitor has been mailed back to [facility]  Call [facility] at [phone] if you have questions regarding  your ZIO XT patch monitor. Call them immediately if you see an orange light blinking on your  monitor.  If your monitor falls off in less than 4 days,  contact our Monitor department at [phone].  If your monitor becomes loose or falls off after 4 days call [employer] at [phone] for  suggestions on securing your monitor

## 2021-03-02 NOTE — Progress Notes (Unsigned)
Enrolled patient for a 14 day Zio XT monitor applied in office.

## 2021-03-25 ENCOUNTER — Other Ambulatory Visit: Payer: Self-pay

## 2021-03-25 ENCOUNTER — Ambulatory Visit (HOSPITAL_COMMUNITY): Payer: BC Managed Care – PPO | Attending: Cardiovascular Disease

## 2021-03-25 DIAGNOSIS — R002 Palpitations: Secondary | ICD-10-CM | POA: Insufficient documentation

## 2021-03-25 LAB — ECHOCARDIOGRAM COMPLETE
Area-P 1/2: 3.37 cm2
S' Lateral: 2.7 cm

## 2022-08-22 ENCOUNTER — Other Ambulatory Visit: Payer: Self-pay | Admitting: Obstetrics and Gynecology

## 2022-08-22 DIAGNOSIS — Z1231 Encounter for screening mammogram for malignant neoplasm of breast: Secondary | ICD-10-CM

## 2022-09-15 ENCOUNTER — Ambulatory Visit
Admission: RE | Admit: 2022-09-15 | Discharge: 2022-09-15 | Disposition: A | Discharge status: Home / Self Care | Payer: BC Managed Care – PPO | Source: Ambulatory Visit | Attending: Obstetrics and Gynecology | Admitting: Obstetrics and Gynecology

## 2022-09-15 DIAGNOSIS — Z1231 Encounter for screening mammogram for malignant neoplasm of breast: Secondary | ICD-10-CM

## 2023-08-01 LAB — COLOGUARD: COLOGUARD: POSITIVE — AB
